# Patient Record
Sex: Female | Born: 1940
Health system: Southern US, Community
[De-identification: ages and names within clinical notes are randomized; demographics above are authoritative.]

## PROBLEM LIST (undated history)

## (undated) DIAGNOSIS — E7800 Pure hypercholesterolemia, unspecified: Secondary | ICD-10-CM

## (undated) HISTORY — PX: CHOLECYSTECTOMY: SHX55

---

## 1999-12-03 ENCOUNTER — Ambulatory Visit (HOSPITAL_BASED_OUTPATIENT_CLINIC_OR_DEPARTMENT_OTHER): Admission: RE | Admit: 1999-12-03 | Discharge: 1999-12-03 | Payer: Self-pay | Admitting: Orthopedic Surgery

## 2001-08-31 ENCOUNTER — Ambulatory Visit (HOSPITAL_COMMUNITY): Admission: RE | Admit: 2001-08-31 | Discharge: 2001-08-31 | Payer: Self-pay | Admitting: Gastroenterology

## 2002-12-24 ENCOUNTER — Encounter: Payer: Self-pay | Admitting: Emergency Medicine

## 2002-12-24 ENCOUNTER — Ambulatory Visit (HOSPITAL_COMMUNITY): Admission: RE | Admit: 2002-12-24 | Discharge: 2002-12-24 | Payer: Self-pay | Admitting: Emergency Medicine

## 2003-04-28 ENCOUNTER — Encounter: Payer: Self-pay | Admitting: Emergency Medicine

## 2003-04-28 ENCOUNTER — Emergency Department (HOSPITAL_COMMUNITY): Admission: EM | Admit: 2003-04-28 | Discharge: 2003-04-29 | Payer: Self-pay | Admitting: Emergency Medicine

## 2003-05-03 ENCOUNTER — Encounter: Payer: Self-pay | Admitting: Cardiology

## 2003-05-03 ENCOUNTER — Ambulatory Visit (HOSPITAL_COMMUNITY): Admission: RE | Admit: 2003-05-03 | Discharge: 2003-05-03 | Payer: Self-pay | Admitting: Cardiology

## 2005-07-23 ENCOUNTER — Encounter: Admission: RE | Admit: 2005-07-23 | Discharge: 2005-07-23 | Payer: Self-pay | Admitting: Emergency Medicine

## 2006-01-26 ENCOUNTER — Encounter: Payer: Self-pay | Admitting: Cardiology

## 2009-06-27 ENCOUNTER — Other Ambulatory Visit: Admission: RE | Admit: 2009-06-27 | Discharge: 2009-06-27 | Payer: Self-pay | Admitting: General Surgery

## 2010-09-18 ENCOUNTER — Ambulatory Visit (HOSPITAL_COMMUNITY)
Admission: RE | Admit: 2010-09-18 | Discharge: 2010-09-18 | Payer: Self-pay | Source: Home / Self Care | Attending: Cardiology | Admitting: Cardiology

## 2011-02-12 NOTE — Procedures (Signed)
[place]. [facility]  Patient:    ANGELES, ZEHNER Visit Number: [id number] MRN: [id number]          Service Type: END Location: ENDO Attending Physician:  Nelda Marseille Dictated by:   Petra Kuba, M.D. Proc. Date: 08/31/01 Admit Date:  08/31/2001   CC:         Oley Balm. Georgina Pillion, M.D.   Procedure Report  PREOPERATIVE DIAGNOSIS:  Colonoscopy.  INDICATION:  Lower abdominal pain, patient due for colonic screening.  Consent was signed after risks, benefits, methods, and options thoroughly discussed in the office.  MEDICATIONS:  Demerol 50 mg, Versed 5 mg.  DESCRIPTION OF PROCEDURE:  Rectal inspection was pertinent for small external hemorrhoids.  Digital exam was negative.  Pediatric video adjustable colonoscope was inserted and with some difficulty due to a tortuous sigmoid with some diverticula, we we were able to advance past that area.  Once past that area, we were easily able to advance to the cecum.  This did not require any position changes but some lower abdominal pressure.  Other than the left-sided tortuous sigmoid filled with some diverticula, no other abnormalities were seen.  The cecum was identified by the appendiceal orifice and the ileocecal valve; in fact, the scope was inserted a short way in the terminal ileum, which was normal.  Photo documentation was obtained.  The scope was slowly withdrawn.  The cecum, ascending, transverse, and majority of the descending looked normal.  The scope was withdrawn around the left side of the colon and again, other than the left-sided diverticula, no other abnormalities were seen.  Once back in the rectum, the scope was then retroflexed, pertinent for some internal hemorrhoids.  The scope was straightened, air was suctioned, scope removed.  The patient tolerated the procedure well.  There was no obvious immediate complication.  ENDOSCOPIC DIAGNOSES: 1. Internal-external hemorrhoids, small. 2.  Sigmoid tortuousity and diverticula. 3. Otherwise within normal limits to the terminal ileum.  PLAN:  GI follow-up p.r.n.  If pain continues, consider lysis of adhesions or CT scan.  Might want to try antispasmodics.  Re-screening in five years, yearly rectals and guaiacs per Dr. Georgina Pillion, and I would be happy to see back if I could be of some assistance. Dictated by:   Petra Kuba, M.D. Attending Physician:  Nelda Marseille DD:  08/31/01 TD:  08/31/01 Job: [phone] [id number]

## 2011-02-12 NOTE — Op Note (Signed)
[place]. [facility]  Patient:    Kara Villa, Kara Villa                         MRN: [id number] Proc. Date: 12/03/99 Adm. Date:  [id number] Disc. Date: [id number] Attending:  Colbert Ewing                           Operative Report  PREOPERATIVE DIAGNOSIS:  Left knee post-traumatic medial plica and synovitis, possible medial meniscus tear.  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  PROCEDURE:  Left knee exam under anesthesia, arthroscopy with chondroplasty of lateral tibial plateau, partial synovectomy, debridement of fraying medial meniscus, and excision of medial plica.  SURGEON:  Loreta Ave, M.D.  ASSISTANT:  Arlys John D. Petrarca, P.A.-C.  ANESTHESIA:  General.  SPECIMENS:  None.  CULTURES:  None.  COMPLICATIONS:  None.  DRESSING:  Self-compressive.  OPERATIVE FINDINGS:  Full motion and stable knee.  At arthroscopy, good patellofemoral tracking and stable ligaments.  Articular cartilage intact with ome just mild grade 2 changes at the patellofemoral joint, chronic with some acute grade 2 and mild grade 3 changes on the lateral tibial plateau.  Debrided to a stable surface and did not account for significant degenerative chondromalacia.  Lateral meniscus intact.  Medial meniscus intact with some fraying at the posterior horn which was easily debrided.  More prominent was hypertrophic synovitis above and below the medial meniscus, all excised.  Also, prominent fibrotic symptomatic medial plica which was excised.  Cruciate ligaments intact.  DESCRIPTION OF PROCEDURE:  The patient was brought to the operating room and placed on the operating table in the supine position.  After adequate anesthesia had been obtained, exam  with the findings as described above.  Tourniquet and leg holder  applied.  Leg prepped and draped in the usual sterile fashion.  Three portals were created, one superolateral, one each medial and lateral parapatellar.  Inflow catheter introduced ________ and the standard arthroscope introduced.  Knee inspected in a serial manner with the findings as described above.  Once the entire knee had been inspected, partial synovectomy, removing hypertrophic synovitis.  Chondroplasty of lateral tibial plateau with a shaver.  Medial meniscus debrided with a shaver, and again, this was just mild fraying, and did not represent a significant tear.  The large fibrotic medial plica was excised and he abrasive changes on the condyle below it were debrided.  Entire knee examined and no other significant findings appreciated.  Instruments and fluid removed. Portals of the knee injected with Marcaine.  Portals closed with 4-0 nylon.  A sterile compressive dressing applied.  Anesthesia reversed and brought to the recovery room.  Tolerated surgery well with no complications. DD:  12/03/99 TD:  12/05/99 Job: [id number]

## 2011-10-15 DIAGNOSIS — N3000 Acute cystitis without hematuria: Secondary | ICD-10-CM | POA: Diagnosis not present

## 2012-04-07 DIAGNOSIS — E785 Hyperlipidemia, unspecified: Secondary | ICD-10-CM | POA: Diagnosis not present

## 2012-04-07 DIAGNOSIS — R5381 Other malaise: Secondary | ICD-10-CM | POA: Diagnosis not present

## 2012-05-17 DIAGNOSIS — S93409A Sprain of unspecified ligament of unspecified ankle, initial encounter: Secondary | ICD-10-CM | POA: Diagnosis not present

## 2012-06-08 DIAGNOSIS — Z79899 Other long term (current) drug therapy: Secondary | ICD-10-CM | POA: Diagnosis not present

## 2012-06-08 DIAGNOSIS — E785 Hyperlipidemia, unspecified: Secondary | ICD-10-CM | POA: Diagnosis not present

## 2012-06-10 DIAGNOSIS — Z23 Encounter for immunization: Secondary | ICD-10-CM | POA: Diagnosis not present

## 2012-07-17 DIAGNOSIS — Z0100 Encounter for examination of eyes and vision without abnormal findings: Secondary | ICD-10-CM | POA: Diagnosis not present

## 2012-07-17 DIAGNOSIS — D485 Neoplasm of uncertain behavior of skin: Secondary | ICD-10-CM | POA: Diagnosis not present

## 2012-07-17 DIAGNOSIS — H2510 Age-related nuclear cataract, unspecified eye: Secondary | ICD-10-CM | POA: Diagnosis not present

## 2012-07-17 DIAGNOSIS — L821 Other seborrheic keratosis: Secondary | ICD-10-CM | POA: Diagnosis not present

## 2012-07-17 DIAGNOSIS — Z85828 Personal history of other malignant neoplasm of skin: Secondary | ICD-10-CM | POA: Diagnosis not present

## 2012-07-26 DIAGNOSIS — C44319 Basal cell carcinoma of skin of other parts of face: Secondary | ICD-10-CM | POA: Diagnosis not present

## 2012-07-26 DIAGNOSIS — C44611 Basal cell carcinoma of skin of unspecified upper limb, including shoulder: Secondary | ICD-10-CM | POA: Diagnosis not present

## 2012-07-26 DIAGNOSIS — D485 Neoplasm of uncertain behavior of skin: Secondary | ICD-10-CM | POA: Diagnosis not present

## 2012-08-11 DIAGNOSIS — C44319 Basal cell carcinoma of skin of other parts of face: Secondary | ICD-10-CM | POA: Diagnosis not present

## 2012-08-11 DIAGNOSIS — C44611 Basal cell carcinoma of skin of unspecified upper limb, including shoulder: Secondary | ICD-10-CM | POA: Diagnosis not present

## 2012-08-29 DIAGNOSIS — Z79899 Other long term (current) drug therapy: Secondary | ICD-10-CM | POA: Diagnosis not present

## 2012-08-29 DIAGNOSIS — Z800 Family history of malignant neoplasm of digestive organs: Secondary | ICD-10-CM | POA: Diagnosis not present

## 2012-08-29 DIAGNOSIS — E785 Hyperlipidemia, unspecified: Secondary | ICD-10-CM | POA: Diagnosis not present

## 2012-08-29 DIAGNOSIS — Z0000 Encounter for general adult medical examination without abnormal findings: Secondary | ICD-10-CM | POA: Diagnosis not present

## 2012-08-29 DIAGNOSIS — E559 Vitamin D deficiency, unspecified: Secondary | ICD-10-CM | POA: Diagnosis not present

## 2012-09-25 DIAGNOSIS — M545 Low back pain, unspecified: Secondary | ICD-10-CM | POA: Diagnosis not present

## 2012-09-29 DIAGNOSIS — L821 Other seborrheic keratosis: Secondary | ICD-10-CM | POA: Diagnosis not present

## 2012-10-26 DIAGNOSIS — Z780 Asymptomatic menopausal state: Secondary | ICD-10-CM | POA: Diagnosis not present

## 2012-10-30 DIAGNOSIS — E559 Vitamin D deficiency, unspecified: Secondary | ICD-10-CM | POA: Diagnosis not present

## 2012-12-09 ENCOUNTER — Encounter (HOSPITAL_COMMUNITY): Payer: Self-pay

## 2012-12-09 ENCOUNTER — Emergency Department (HOSPITAL_COMMUNITY)
Admission: EM | Admit: 2012-12-09 | Discharge: 2012-12-09 | Disposition: A | Discharge status: Home / Self Care | Payer: Medicare Other | Attending: Emergency Medicine | Admitting: Emergency Medicine

## 2012-12-09 ENCOUNTER — Emergency Department (HOSPITAL_COMMUNITY): Payer: Medicare Other

## 2012-12-09 DIAGNOSIS — R197 Diarrhea, unspecified: Secondary | ICD-10-CM | POA: Insufficient documentation

## 2012-12-09 DIAGNOSIS — A088 Other specified intestinal infections: Secondary | ICD-10-CM | POA: Diagnosis not present

## 2012-12-09 DIAGNOSIS — R112 Nausea with vomiting, unspecified: Secondary | ICD-10-CM

## 2012-12-09 DIAGNOSIS — E7800 Pure hypercholesterolemia, unspecified: Secondary | ICD-10-CM | POA: Diagnosis not present

## 2012-12-09 DIAGNOSIS — R109 Unspecified abdominal pain: Secondary | ICD-10-CM | POA: Diagnosis not present

## 2012-12-09 DIAGNOSIS — A084 Viral intestinal infection, unspecified: Secondary | ICD-10-CM

## 2012-12-09 HISTORY — DX: Pure hypercholesterolemia, unspecified: E78.00

## 2012-12-09 LAB — POCT I-STAT, CHEM 8
BUN: 12 mg/dL (ref 6–23)
Calcium, Ion: 1.04 mmol/L — ABNORMAL LOW (ref 1.13–1.30)
Chloride: 109 mEq/L (ref 96–112)
Creatinine, Ser: 0.6 mg/dL (ref 0.50–1.10)
HCT: 37 % (ref 36.0–46.0)
Hemoglobin: 12.6 g/dL (ref 12.0–15.0)
Sodium: 142 mEq/L (ref 135–145)
TCO2: 27 mmol/L (ref 0–100)

## 2012-12-09 LAB — CBC
HCT: 37.4 % (ref 36.0–46.0)
Hemoglobin: 12.9 g/dL (ref 12.0–15.0)
MCH: 30.7 pg (ref 26.0–34.0)
MCV: 89 fL (ref 78.0–100.0)
Platelets: 230 10*3/uL (ref 150–400)
RBC: 4.2 MIL/uL (ref 3.87–5.11)

## 2012-12-09 MED ORDER — DIPHENOXYLATE-ATROPINE 2.5-0.025 MG PO TABS
1.0000 | ORAL_TABLET | Freq: Once | ORAL | Status: AC
  Administered 2012-12-09: 1 via ORAL
  Filled 2012-12-09: qty 1

## 2012-12-09 MED ORDER — SODIUM CHLORIDE 0.9 % IV BOLUS (SEPSIS)
1000.0000 mL | Freq: Once | INTRAVENOUS | Status: AC
  Administered 2012-12-09: 1000 mL via INTRAVENOUS

## 2012-12-09 MED ORDER — ONDANSETRON HCL 4 MG/2ML IJ SOLN
4.0000 mg | Freq: Once | INTRAMUSCULAR | Status: AC
  Administered 2012-12-09: 4 mg via INTRAVENOUS
  Filled 2012-12-09: qty 2

## 2012-12-09 MED ORDER — ONDANSETRON HCL 4 MG PO TABS: 4.0000 mg | ORAL_TABLET | Freq: Four times a day (QID) | ORAL | Status: AC

## 2012-12-09 NOTE — ED Notes (Signed)
Patient transported to X-ray 

## 2012-12-09 NOTE — Discharge Instructions (Signed)
Stay well hydrated. Use zofran for nausea. Eat a bland diet for the next few days. Your labs and xray were normal today.  B.R.A.T. Diet Your doctor has recommended the B.R.A.T. diet for you or your child until the condition improves. This is often used to help control diarrhea and vomiting symptoms. If you or your child can tolerate clear liquids, you may have:  Bananas.   Rice.   Applesauce.   Toast (and other simple starches such as crackers, potatoes, noodles).  Be sure to avoid dairy products, meats, and fatty foods until symptoms are better. Fruit juices such as apple, grape, and prune juice can make diarrhea worse. Avoid these. Continue this diet for 2 days or as instructed by your caregiver. Document Released: 09/13/2005 Document Revised: 09/02/2011 Document Reviewed: 03/02/2007 [facility] Patient Information 2012 [place], [place]. Diet for Diarrhea, Adult Having frequent, runny stools (diarrhea) has many causes. Diarrhea may be caused or worsened by food or drink. Diarrhea may be relieved by changing your diet. IF YOU ARE NOT TOLERATING SOLID FOODS:  Drink enough water and fluids to keep your urine clear or pale yellow.  Avoid sugary drinks and sodas as well as milk-based beverages.  Avoid beverages containing caffeine and alcohol.  You may try rehydrating beverages. You can make your own by following this recipe:   tsp table salt.   tsp baking soda.   tsp salt substitute (potassium chloride).  1 tbs + 1 tsp sugar.  1 qt water. As your stools become more solid, you can start eating solid foods. Add foods one at a time. If a certain food causes your diarrhea to get worse, avoid that food and try other foods. A low fiber, low-fat, and lactose-free diet is recommended. Small, frequent meals may be better tolerated.  Starches  Allowed:  White, [place], and pita breads, plain rolls, buns, bagels. Plain muffins, matzo. Soda, saltine, or [place] crackers. Pretzels, melba toast,  zwieback. Cooked cereals made with water: cornmeal, farina, cream cereals. Dry cereals: refined corn, wheat, rice. Potatoes prepared any way without skins, refined macaroni, spaghetti, noodles, refined rice.  Avoid:  Bread, rolls, or crackers made with whole wheat, multi-grains, rye, bran seeds, nuts, or coconut. Corn tortillas or taco shells. Cereals containing whole grains, multi-grains, bran, coconut, nuts, or raisins. Cooked or dry oatmeal. Coarse wheat cereals, granola. Cereals advertised as "high-fiber." Potato skins. Whole grain pasta, wild or brown rice. Popcorn. Sweet potatoes/yams. Sweet rolls, doughnuts, waffles, pancakes, sweet breads. Vegetables  Allowed: Strained tomato and vegetable juices. Most well-cooked and canned vegetables without seeds. Fresh: Tender lettuce, cucumber without the skin, cabbage, spinach, bean sprouts.  Avoid: Fresh, cooked, or canned: Artichokes, baked beans, beet greens, broccoli, Brussels sprouts, corn, kale, legumes, peas, sweet potatoes. Cooked: Green or red cabbage, spinach. Avoid large servings of any vegetables, because vegetables shrink when cooked, and they contain more fiber per serving than fresh vegetables. Fruit  Allowed: All fruit juices except prune juice. Cooked or canned: Apricots, applesauce, cantaloupe, cherries, fruit cocktail, grapefruit, grapes, kiwi, mandarin oranges, peaches, pears, plums, watermelon. Fresh: Apples without skin, ripe banana, grapes, cantaloupe, cherries, grapefruit, peaches, oranges, plums. Keep servings limited to  cup or 1 piece.  Avoid: Fresh: Apple with skin, apricots, mango, pears, raspberries, strawberries. Prune juice, stewed or dried prunes. Dried fruits, raisins, dates. Large servings of all fresh fruits. Meat and Meat Substitutes  Allowed: Ground or well-cooked tender beef, ham, veal, lamb, pork, or poultry. Eggs, plain cheese. Fish, oysters, shrimp, lobster, other seafoods. Liver, organ  meats.  Avoid: Tough,  fibrous meats with gristle. Peanut butter, smooth or chunky. Cheese, nuts, seeds, legumes, dried peas, beans, lentils. Milk  Allowed: Yogurt, lactose-free milk, kefir, drinkable yogurt, buttermilk, soy milk.  Avoid: Milk, chocolate milk, beverages made with milk, such as milk shakes. Soups  Allowed: Bouillon, broth, or soups made from allowed foods. Any strained soup.  Avoid: Soups made from vegetables that are not allowed, cream or milk-based soups. Desserts and Sweets  Allowed: Sugar-free gelatin, sugar-free frozen ice pops made without sugar alcohol.  Avoid: Plain cakes and cookies, pie made with allowed fruit, pudding, custard, cream pie. Gelatin, fruit, ice, sherbet, frozen ice pops. Ice cream, ice milk without nuts. Plain hard candy, honey, jelly, molasses, syrup, sugar, chocolate syrup, gumdrops, marshmallows. Fats and Oils  Allowed: Avoid any fats and oils.  Avoid: Seeds, nuts, olives, avocados. Margarine, butter, cream, mayonnaise, salad oils, plain salad dressings made from allowed foods. Plain gravy, crisp bacon without rind. Beverages  Allowed: Water, decaffeinated teas, oral rehydration solutions, sugar-free beverages.  Avoid: Fruit juices, caffeinated beverages (coffee, tea, soda or pop), alcohol, sports drinks, or lemon-lime soda or pop. Condiments  Allowed: Ketchup, mustard, horseradish, vinegar, cream sauce, cheese sauce, cocoa powder. Spices in moderation: allspice, basil, bay leaves, celery powder or leaves, cinnamon, cumin powder, curry powder, ginger, mace, marjoram, onion or garlic powder, oregano, paprika, parsley flakes, ground pepper, rosemary, sage, savory, tarragon, thyme, turmeric.  Avoid: Coconut, honey. Weight Monitoring: Weigh yourself every day. You should weigh yourself in the morning after you urinate and before you eat breakfast. Wear the same amount of clothing when you weigh yourself. Record your weight daily. Bring your recorded weights to your  clinic visits. Tell your caregiver right away if you have gained 3 lb/1.4 kg or more in 1 day, 5 lb/2.3 kg in a week, or whatever amount you were told to report. SEEK IMMEDIATE MEDICAL CARE IF:   You are unable to keep fluids down.  You start to throw up (vomit) or diarrhea keeps coming back (persistent).  Abdominal pain develops, increases, or can be felt in one place (localizes).  You have an oral temperature above 102 F (38.9 C), not controlled by medicine.  Diarrhea contains blood or mucus.  You develop excessive weakness, dizziness, fainting, or extreme thirst. MAKE SURE YOU:   Understand these instructions.  Will watch your condition.  Will get help right away if you are not doing well or get worse. Document Released: 12/04/2003 Document Revised: 12/06/2011 Document Reviewed: 01/28/2012 [facility] Patient Information 2013 [place], [place].  Nausea and Vomiting Nausea is a sick feeling that often comes before throwing up (vomiting). Vomiting is a reflex where stomach contents come out of your mouth. Vomiting can cause severe loss of body fluids (dehydration). Children and elderly adults can become dehydrated quickly, especially if they also have diarrhea. Nausea and vomiting are symptoms of a condition or disease. It is important to find the cause of your symptoms. CAUSES   Direct irritation of the stomach lining. This irritation can result from increased acid production (gastroesophageal reflux disease), infection, food poisoning, taking certain medicines (such as nonsteroidal anti-inflammatory drugs), alcohol use, or tobacco use.  Signals from the brain.These signals could be caused by a headache, heat exposure, an inner ear disturbance, increased pressure in the brain from injury, infection, a tumor, or a concussion, pain, emotional stimulus, or metabolic problems.  An obstruction in the gastrointestinal tract (bowel obstruction).  Illnesses such as diabetes, hepatitis,  gallbladder problems, appendicitis, kidney  problems, cancer, sepsis, atypical symptoms of a heart attack, or eating disorders.  Medical treatments such as chemotherapy and radiation.  Receiving medicine that makes you sleep (general anesthetic) during surgery. DIAGNOSIS Your caregiver may ask for tests to be done if the problems do not improve after a few days. Tests may also be done if symptoms are severe or if the reason for the nausea and vomiting is not clear. Tests may include:  Urine tests.  Blood tests.  Stool tests.  Cultures (to look for evidence of infection).  X-rays or other imaging studies. Test results can help your caregiver make decisions about treatment or the need for additional tests. TREATMENT You need to stay well hydrated. Drink frequently but in small amounts.You may wish to drink water, sports drinks, clear broth, or eat frozen ice pops or gelatin dessert to help stay hydrated.When you eat, eating slowly may help prevent nausea.There are also some antinausea medicines that may help prevent nausea. HOME CARE INSTRUCTIONS   Take all medicine as directed by your caregiver.  If you do not have an appetite, do not force yourself to eat. However, you must continue to drink fluids.  If you have an appetite, eat a normal diet unless your caregiver tells you differently.  Eat a variety of complex carbohydrates (rice, wheat, potatoes, bread), lean meats, yogurt, fruits, and vegetables.  Avoid high-fat foods because they are more difficult to digest.  Drink enough water and fluids to keep your urine clear or pale yellow.  If you are dehydrated, ask your caregiver for specific rehydration instructions. Signs of dehydration may include:  Severe thirst.  Dry lips and mouth.  Dizziness.  Dark urine.  Decreasing urine frequency and amount.  Confusion.  Rapid breathing or pulse. SEEK IMMEDIATE MEDICAL CARE IF:   You have blood or brown flecks (like coffee  grounds) in your vomit.  You have black or bloody stools.  You have a severe headache or stiff neck.  You are confused.  You have severe abdominal pain.  You have chest pain or trouble breathing.  You do not urinate at least once every 8 hours.  You develop cold or clammy skin.  You continue to vomit for longer than 24 to 48 hours.  You have a fever. MAKE SURE YOU:   Understand these instructions.  Will watch your condition.  Will get help right away if you are not doing well or get worse. Document Released: 09/13/2005 Document Revised: 12/06/2011 Document Reviewed: 02/10/2011 [facility] Patient Information 2013 [place], [place].  Viral Gastroenteritis Viral gastroenteritis is also known as stomach flu. This condition affects the stomach and intestinal tract. It can cause sudden diarrhea and vomiting. The illness typically lasts 3 to 8 days. Most people develop an immune response that eventually gets rid of the virus. While this natural response develops, the virus can make you quite ill. CAUSES  Many different viruses can cause gastroenteritis, such as rotavirus or noroviruses. You can catch one of these viruses by consuming contaminated food or water. You may also catch a virus by sharing utensils or other personal items with an infected person or by touching a contaminated surface. SYMPTOMS  The most common symptoms are diarrhea and vomiting. These problems can cause a severe loss of body fluids (dehydration) and a body salt (electrolyte) imbalance. Other symptoms may include:  Fever.  Headache.  Fatigue.  Abdominal pain. DIAGNOSIS  Your caregiver can usually diagnose viral gastroenteritis based on your symptoms and a physical exam. A stool  sample may also be taken to test for the presence of viruses or other infections. TREATMENT  This illness typically goes away on its own. Treatments are aimed at rehydration. The most serious cases of viral gastroenteritis involve  vomiting so severely that you are not able to keep fluids down. In these cases, fluids must be given through an intravenous line (IV). HOME CARE INSTRUCTIONS   Drink enough fluids to keep your urine clear or pale yellow. Drink small amounts of fluids frequently and increase the amounts as tolerated.  Ask your caregiver for specific rehydration instructions.  Avoid:  Foods high in sugar.  Alcohol.  Carbonated drinks.  Tobacco.  Juice.  Caffeine drinks.  Extremely hot or cold fluids.  Fatty, greasy foods.  Too much intake of anything at one time.  Dairy products until 24 to 48 hours after diarrhea stops.  You may consume probiotics. Probiotics are active cultures of beneficial bacteria. They may lessen the amount and number of diarrheal stools in adults. Probiotics can be found in yogurt with active cultures and in supplements.  Wash your hands well to avoid spreading the virus.  Only take over-the-counter or prescription medicines for pain, discomfort, or fever as directed by your caregiver. Do not give aspirin to children. Antidiarrheal medicines are not recommended.  Ask your caregiver if you should continue to take your regular prescribed and over-the-counter medicines.  Keep all follow-up appointments as directed by your caregiver. SEEK IMMEDIATE MEDICAL CARE IF:   You are unable to keep fluids down.  You do not urinate at least once every 6 to 8 hours.  You develop shortness of breath.  You notice blood in your stool or vomit. This may look like coffee grounds.  You have abdominal pain that increases or is concentrated in one small area (localized).  You have persistent vomiting or diarrhea.  You have a fever.  The patient is a child younger than 3 months, and he or she has a fever.  The patient is a child older than 3 months, and he or she has a fever and persistent symptoms.  The patient is a child older than 3 months, and he or she has a fever and  symptoms suddenly get worse.  The patient is a baby, and he or she has no tears when crying. MAKE SURE YOU:   Understand these instructions.  Will watch your condition.  Will get help right away if you are not doing well or get worse. Document Released: 09/13/2005 Document Revised: 12/06/2011 Document Reviewed: 06/30/2011 [facility] Patient Information 2013 [place], [place].

## 2012-12-09 NOTE — ED Notes (Signed)
Pt here by ems for n/v/d after eating at [facility]. Denies any other symptoms. Denies pain.

## 2012-12-09 NOTE — ED Provider Notes (Signed)
History     CSN: [id number]  Arrival date & time 12/09/12  0847   First MD Initiated Contact with Patient 12/09/12 [phone]      No chief complaint on file.   (Consider location/radiation/quality/duration/timing/severity/associated sxs/prior treatment) HPI Comments: 72 year old female presents to the emergency department complaining of sudden onset nausea, vomiting and diarrhea x 1 day beginning around 1:00 this morning. States she was taking her dog out in the middle of the night when symptoms began. Prior to going to bed last night patient states she is feeling fine. Admits to eating tackled fell around 3 clock in the afternoon yesterday followed by some "funny looking" orange Jell-O and ice cream later at night. She has had about 4 episodes of vomiting and multiple episodes of diarrhea. Denies hematemesis or hematochezia. Denies abdominal pain, fever, chills or diaphoresis. No sick contacts.  The history is provided by the patient.    Past Medical History  Diagnosis Date  . High cholesterol     No past surgical history on file.  No family history on file.  History  Substance Use Topics  . Smoking status: Not on file  . Smokeless tobacco: Not on file  . Alcohol Use: Not on file    OB History   Grav Para Term Preterm Abortions TAB SAB Ect Mult Living                  Review of Systems  Constitutional: Negative for fever, chills and activity change.  Gastrointestinal: Positive for nausea, vomiting and diarrhea. Negative for abdominal pain and blood in stool.  Musculoskeletal: Negative for back pain.  Neurological: Positive for weakness. Negative for dizziness, light-headedness and headaches.  All other systems reviewed and are negative.    Allergies  Azithromycin  Home Medications   Current Outpatient Rx  Name  Route  Sig  Dispense  Refill  . calcium citrate-vitamin D (CITRACAL+D) 315-200 MG-UNIT per tablet   Oral   Take 1 tablet by mouth 2 (two) times daily.          . Cholecalciferol (VITAMIN D3) 2000 UNITS capsule   Oral   Take 2,000 Units by mouth daily.         . Red Yeast Rice 600 MG CAPS   Oral   Take 1 capsule by mouth 2 (two) times daily.           BP 135/58  Pulse 84  Temp(Src) 98.7 F (37.1 C) (Oral)  Resp 14  SpO2 100%  Physical Exam  Nursing note and vitals reviewed. Constitutional: She is oriented to person, place, and time. She appears well-developed and well-nourished. No distress.  HENT:  Head: Normocephalic and atraumatic.  Mouth/Throat: Oropharynx is clear and moist and mucous membranes are normal. Mucous membranes are not pale and not dry.  Eyes: Conjunctivae and EOM are normal. Pupils are equal, round, and reactive to light.  Neck: Normal range of motion. Neck supple.  Cardiovascular: Normal rate, regular rhythm, normal heart sounds and intact distal pulses.   Pulmonary/Chest: Effort normal and breath sounds normal. No respiratory distress.  Abdominal: Soft. Normal appearance. She exhibits no distension and no mass. Bowel sounds are increased. There is generalized tenderness (to deep palpation only described as "discomfort" rather than pain). There is no rigidity, no rebound and no guarding.  Musculoskeletal: Normal range of motion. She exhibits no edema.  Neurological: She is alert and oriented to person, place, and time.  Skin: Skin is warm and dry. She is  not diaphoretic. No pallor.  Psychiatric: She has a normal mood and affect. Her behavior is normal.    ED Course  Procedures (including critical care time)  Labs Reviewed  POCT I-STAT, CHEM 8 - Abnormal; Notable for the following:    Glucose, Bld 105 (*)    Calcium, Ion 1.04 (*)    All other components within normal limits  CBC   Dg Abd Acute W/chest  12/09/2012  *RADIOLOGY REPORT*  Clinical Data: 72 year old female with abdominal pain, nausea, vomiting and diarrhea.  ACUTE ABDOMEN SERIES (ABDOMEN 2 VIEW & CHEST 1 VIEW)  Comparison: None  Findings:  The cardiomediastinal silhouette is unremarkable. The lungs are clear. There is no evidence of airspace disease, consolidation, pleural effusion or pneumothorax.  A few nondistended gas and fluid filled loops of small bowel and colon noted. There is no evidence of dilated bowel loops or pneumoperitoneum. No suspicious calcifications are present. Cholecystectomy clips are identified.  IMPRESSION: Nonspecific nonobstructive bowel gas pattern.  This appearance can be seen with gastroenteritis.  No evidence of acute cardiopulmonary disease.   Original Report Authenticated By: Harmon Pier, M.D.      1. Viral gastroenteritis   2. Nausea and vomiting   3. Diarrhea       MDM  Viral gastroenteritis- obtained basic labs and AAS due to hx of multiple abdominal surgeries. AAS with evidence of viral etiology, no other acute finding. Labs unremarkable. She feels much better with fluids, zofran and lomotil. She does not want any medications to go home with, however I did get her to agree to zofran in case she becomes nauseated. Return precautions discussed. Case discussed with Dr. Lynelle Doctor who also evaluated patient and agrees with plan of care.        Trevor Mace, PA-C 12/09/12 1122

## 2012-12-09 NOTE — ED Provider Notes (Signed)
Medical screening examination/treatment/procedure(s) were conducted as a shared visit with non-physician practitioner(s) and myself.  I personally evaluated the patient during the encounter   Celene Kras, MD 12/09/12 1126

## 2012-12-09 NOTE — ED Provider Notes (Signed)
Pt presents with n/v/d after [employer] last night.  She has some abdominal discomfort although mild.  Has history of prior surgery.  GB, appendectomy, hysterctomy.   Physical Exam  BP 135/58  Pulse 84  Temp(Src) 98.7 F (37.1 C) (Oral)  Resp 14  SpO2 100%  Physical Exam  Nursing note and vitals reviewed. Constitutional: She appears well-developed and well-nourished. No distress.  HENT:  Head: Normocephalic and atraumatic.  Right Ear: External ear normal.  Left Ear: External ear normal.  Eyes: Conjunctivae are normal. Right eye exhibits no discharge. Left eye exhibits no discharge. No scleral icterus.  Neck: Neck supple. No tracheal deviation present.  Cardiovascular: Normal rate.   Pulmonary/Chest: Effort normal. No stridor. No respiratory distress.  Abdominal: Soft. She exhibits no distension. There is no tenderness. There is no rebound and no guarding.  Musculoskeletal: She exhibits no edema.  Neurological: She is alert. Cranial nerve deficit: no gross deficits.  Skin: Skin is warm and dry. No rash noted.  Psychiatric: She has a normal mood and affect.    ED Course  Procedures  MDM Fluids, antiemetics.  Check labs and aas.      Celene Kras, MD 12/09/12 [phone]

## 2013-01-01 DIAGNOSIS — W57XXXA Bitten or stung by nonvenomous insect and other nonvenomous arthropods, initial encounter: Secondary | ICD-10-CM | POA: Diagnosis not present

## 2013-05-01 DIAGNOSIS — E559 Vitamin D deficiency, unspecified: Secondary | ICD-10-CM | POA: Diagnosis not present

## 2013-06-18 DIAGNOSIS — Z23 Encounter for immunization: Secondary | ICD-10-CM | POA: Diagnosis not present

## 2013-06-19 DIAGNOSIS — L820 Inflamed seborrheic keratosis: Secondary | ICD-10-CM | POA: Diagnosis not present

## 2013-07-23 DIAGNOSIS — Z85828 Personal history of other malignant neoplasm of skin: Secondary | ICD-10-CM | POA: Diagnosis not present

## 2013-07-23 DIAGNOSIS — L821 Other seborrheic keratosis: Secondary | ICD-10-CM | POA: Diagnosis not present

## 2013-07-23 DIAGNOSIS — D1801 Hemangioma of skin and subcutaneous tissue: Secondary | ICD-10-CM | POA: Diagnosis not present

## 2013-08-30 DIAGNOSIS — Z0000 Encounter for general adult medical examination without abnormal findings: Secondary | ICD-10-CM | POA: Diagnosis not present

## 2013-08-30 DIAGNOSIS — Z800 Family history of malignant neoplasm of digestive organs: Secondary | ICD-10-CM | POA: Diagnosis not present

## 2013-08-30 DIAGNOSIS — K219 Gastro-esophageal reflux disease without esophagitis: Secondary | ICD-10-CM | POA: Diagnosis not present

## 2013-08-30 DIAGNOSIS — M899 Disorder of bone, unspecified: Secondary | ICD-10-CM | POA: Diagnosis not present

## 2013-08-30 DIAGNOSIS — E559 Vitamin D deficiency, unspecified: Secondary | ICD-10-CM | POA: Diagnosis not present

## 2013-08-30 DIAGNOSIS — E785 Hyperlipidemia, unspecified: Secondary | ICD-10-CM | POA: Diagnosis not present

## 2013-08-30 DIAGNOSIS — N6019 Diffuse cystic mastopathy of unspecified breast: Secondary | ICD-10-CM | POA: Diagnosis not present

## 2013-08-30 DIAGNOSIS — C4491 Basal cell carcinoma of skin, unspecified: Secondary | ICD-10-CM | POA: Diagnosis not present

## 2013-08-30 DIAGNOSIS — H409 Unspecified glaucoma: Secondary | ICD-10-CM | POA: Diagnosis not present

## 2013-09-03 DIAGNOSIS — H2510 Age-related nuclear cataract, unspecified eye: Secondary | ICD-10-CM | POA: Diagnosis not present

## 2013-09-03 DIAGNOSIS — H5210 Myopia, unspecified eye: Secondary | ICD-10-CM | POA: Diagnosis not present

## 2013-11-16 DIAGNOSIS — E559 Vitamin D deficiency, unspecified: Secondary | ICD-10-CM | POA: Diagnosis not present

## 2014-02-22 DIAGNOSIS — Z1231 Encounter for screening mammogram for malignant neoplasm of breast: Secondary | ICD-10-CM | POA: Diagnosis not present

## 2014-03-12 DIAGNOSIS — L820 Inflamed seborrheic keratosis: Secondary | ICD-10-CM | POA: Diagnosis not present

## 2014-03-12 DIAGNOSIS — D235 Other benign neoplasm of skin of trunk: Secondary | ICD-10-CM | POA: Diagnosis not present

## 2014-06-25 DIAGNOSIS — Z23 Encounter for immunization: Secondary | ICD-10-CM | POA: Diagnosis not present

## 2014-07-24 DIAGNOSIS — D2271 Melanocytic nevi of right lower limb, including hip: Secondary | ICD-10-CM | POA: Diagnosis not present

## 2014-07-24 DIAGNOSIS — C44619 Basal cell carcinoma of skin of left upper limb, including shoulder: Secondary | ICD-10-CM | POA: Diagnosis not present

## 2014-07-24 DIAGNOSIS — Z85828 Personal history of other malignant neoplasm of skin: Secondary | ICD-10-CM | POA: Diagnosis not present

## 2014-07-24 DIAGNOSIS — L820 Inflamed seborrheic keratosis: Secondary | ICD-10-CM | POA: Diagnosis not present

## 2014-07-24 DIAGNOSIS — L821 Other seborrheic keratosis: Secondary | ICD-10-CM | POA: Diagnosis not present

## 2014-07-24 DIAGNOSIS — D492 Neoplasm of unspecified behavior of bone, soft tissue, and skin: Secondary | ICD-10-CM | POA: Diagnosis not present

## 2014-07-26 DIAGNOSIS — N952 Postmenopausal atrophic vaginitis: Secondary | ICD-10-CM | POA: Diagnosis not present

## 2014-07-26 DIAGNOSIS — N898 Other specified noninflammatory disorders of vagina: Secondary | ICD-10-CM | POA: Diagnosis not present

## 2014-09-05 DIAGNOSIS — M899 Disorder of bone, unspecified: Secondary | ICD-10-CM | POA: Diagnosis not present

## 2014-09-05 DIAGNOSIS — E559 Vitamin D deficiency, unspecified: Secondary | ICD-10-CM | POA: Diagnosis not present

## 2014-09-05 DIAGNOSIS — Z79899 Other long term (current) drug therapy: Secondary | ICD-10-CM | POA: Diagnosis not present

## 2014-09-05 DIAGNOSIS — N760 Acute vaginitis: Secondary | ICD-10-CM | POA: Diagnosis not present

## 2014-09-05 DIAGNOSIS — Z800 Family history of malignant neoplasm of digestive organs: Secondary | ICD-10-CM | POA: Diagnosis not present

## 2014-09-05 DIAGNOSIS — K219 Gastro-esophageal reflux disease without esophagitis: Secondary | ICD-10-CM | POA: Diagnosis not present

## 2014-09-05 DIAGNOSIS — Z0000 Encounter for general adult medical examination without abnormal findings: Secondary | ICD-10-CM | POA: Diagnosis not present

## 2014-09-05 DIAGNOSIS — E785 Hyperlipidemia, unspecified: Secondary | ICD-10-CM | POA: Diagnosis not present

## 2014-09-06 DIAGNOSIS — H2513 Age-related nuclear cataract, bilateral: Secondary | ICD-10-CM | POA: Diagnosis not present

## 2014-09-06 DIAGNOSIS — H5203 Hypermetropia, bilateral: Secondary | ICD-10-CM | POA: Diagnosis not present

## 2014-09-11 DIAGNOSIS — D485 Neoplasm of uncertain behavior of skin: Secondary | ICD-10-CM | POA: Diagnosis not present

## 2014-09-11 DIAGNOSIS — D0472 Carcinoma in situ of skin of left lower limb, including hip: Secondary | ICD-10-CM | POA: Diagnosis not present

## 2014-09-11 DIAGNOSIS — C44619 Basal cell carcinoma of skin of left upper limb, including shoulder: Secondary | ICD-10-CM | POA: Diagnosis not present

## 2014-10-23 DIAGNOSIS — M899 Disorder of bone, unspecified: Secondary | ICD-10-CM | POA: Diagnosis not present

## 2014-10-23 DIAGNOSIS — M8580 Other specified disorders of bone density and structure, unspecified site: Secondary | ICD-10-CM | POA: Diagnosis not present

## 2014-11-06 DIAGNOSIS — D0472 Carcinoma in situ of skin of left lower limb, including hip: Secondary | ICD-10-CM | POA: Diagnosis not present

## 2014-12-13 DIAGNOSIS — D485 Neoplasm of uncertain behavior of skin: Secondary | ICD-10-CM | POA: Diagnosis not present

## 2014-12-13 DIAGNOSIS — L821 Other seborrheic keratosis: Secondary | ICD-10-CM | POA: Diagnosis not present

## 2014-12-13 DIAGNOSIS — L820 Inflamed seborrheic keratosis: Secondary | ICD-10-CM | POA: Diagnosis not present

## 2014-12-13 DIAGNOSIS — Z85828 Personal history of other malignant neoplasm of skin: Secondary | ICD-10-CM | POA: Diagnosis not present

## 2015-02-25 DIAGNOSIS — Z1231 Encounter for screening mammogram for malignant neoplasm of breast: Secondary | ICD-10-CM | POA: Diagnosis not present

## 2015-03-10 DIAGNOSIS — H2513 Age-related nuclear cataract, bilateral: Secondary | ICD-10-CM | POA: Diagnosis not present

## 2015-03-10 DIAGNOSIS — H5203 Hypermetropia, bilateral: Secondary | ICD-10-CM | POA: Diagnosis not present

## 2015-07-18 DIAGNOSIS — D225 Melanocytic nevi of trunk: Secondary | ICD-10-CM | POA: Diagnosis not present

## 2015-07-18 DIAGNOSIS — D485 Neoplasm of uncertain behavior of skin: Secondary | ICD-10-CM | POA: Diagnosis not present

## 2015-07-18 DIAGNOSIS — L821 Other seborrheic keratosis: Secondary | ICD-10-CM | POA: Diagnosis not present

## 2015-07-18 DIAGNOSIS — D1801 Hemangioma of skin and subcutaneous tissue: Secondary | ICD-10-CM | POA: Diagnosis not present

## 2015-07-18 DIAGNOSIS — Z23 Encounter for immunization: Secondary | ICD-10-CM | POA: Diagnosis not present

## 2015-07-18 DIAGNOSIS — L739 Follicular disorder, unspecified: Secondary | ICD-10-CM | POA: Diagnosis not present

## 2015-07-18 DIAGNOSIS — Z85828 Personal history of other malignant neoplasm of skin: Secondary | ICD-10-CM | POA: Diagnosis not present

## 2015-07-18 DIAGNOSIS — L820 Inflamed seborrheic keratosis: Secondary | ICD-10-CM | POA: Diagnosis not present

## 2015-07-18 DIAGNOSIS — L812 Freckles: Secondary | ICD-10-CM | POA: Diagnosis not present

## 2015-07-18 DIAGNOSIS — D2239 Melanocytic nevi of other parts of face: Secondary | ICD-10-CM | POA: Diagnosis not present

## 2015-09-09 DIAGNOSIS — H40033 Anatomical narrow angle, bilateral: Secondary | ICD-10-CM | POA: Diagnosis not present

## 2015-09-09 DIAGNOSIS — H2513 Age-related nuclear cataract, bilateral: Secondary | ICD-10-CM | POA: Diagnosis not present

## 2015-09-17 DIAGNOSIS — E785 Hyperlipidemia, unspecified: Secondary | ICD-10-CM | POA: Diagnosis not present

## 2015-09-17 DIAGNOSIS — C4491 Basal cell carcinoma of skin, unspecified: Secondary | ICD-10-CM | POA: Diagnosis not present

## 2015-09-17 DIAGNOSIS — M899 Disorder of bone, unspecified: Secondary | ICD-10-CM | POA: Diagnosis not present

## 2015-09-17 DIAGNOSIS — E559 Vitamin D deficiency, unspecified: Secondary | ICD-10-CM | POA: Diagnosis not present

## 2015-09-17 DIAGNOSIS — Z0000 Encounter for general adult medical examination without abnormal findings: Secondary | ICD-10-CM | POA: Diagnosis not present

## 2015-09-17 DIAGNOSIS — K219 Gastro-esophageal reflux disease without esophagitis: Secondary | ICD-10-CM | POA: Diagnosis not present

## 2015-09-17 DIAGNOSIS — Z23 Encounter for immunization: Secondary | ICD-10-CM | POA: Diagnosis not present

## 2015-09-17 DIAGNOSIS — H409 Unspecified glaucoma: Secondary | ICD-10-CM | POA: Diagnosis not present

## 2015-10-09 DIAGNOSIS — H21561 Pupillary abnormality, right eye: Secondary | ICD-10-CM | POA: Diagnosis not present

## 2015-10-09 DIAGNOSIS — H25811 Combined forms of age-related cataract, right eye: Secondary | ICD-10-CM | POA: Diagnosis not present

## 2015-10-09 DIAGNOSIS — H2511 Age-related nuclear cataract, right eye: Secondary | ICD-10-CM | POA: Diagnosis not present

## 2015-12-02 DIAGNOSIS — D124 Benign neoplasm of descending colon: Secondary | ICD-10-CM | POA: Diagnosis not present

## 2015-12-02 DIAGNOSIS — D126 Benign neoplasm of colon, unspecified: Secondary | ICD-10-CM | POA: Diagnosis not present

## 2015-12-02 DIAGNOSIS — Z86010 Personal history of colonic polyps: Secondary | ICD-10-CM | POA: Diagnosis not present

## 2015-12-02 DIAGNOSIS — K5730 Diverticulosis of large intestine without perforation or abscess without bleeding: Secondary | ICD-10-CM | POA: Diagnosis not present

## 2016-01-21 DIAGNOSIS — L812 Freckles: Secondary | ICD-10-CM | POA: Diagnosis not present

## 2016-01-21 DIAGNOSIS — L821 Other seborrheic keratosis: Secondary | ICD-10-CM | POA: Diagnosis not present

## 2016-01-21 DIAGNOSIS — L820 Inflamed seborrheic keratosis: Secondary | ICD-10-CM | POA: Diagnosis not present

## 2016-01-21 DIAGNOSIS — D225 Melanocytic nevi of trunk: Secondary | ICD-10-CM | POA: Diagnosis not present

## 2016-01-21 DIAGNOSIS — D1801 Hemangioma of skin and subcutaneous tissue: Secondary | ICD-10-CM | POA: Diagnosis not present

## 2016-01-21 DIAGNOSIS — Z85828 Personal history of other malignant neoplasm of skin: Secondary | ICD-10-CM | POA: Diagnosis not present

## 2016-01-30 DIAGNOSIS — H0015 Chalazion left lower eyelid: Secondary | ICD-10-CM | POA: Diagnosis not present

## 2016-02-26 DIAGNOSIS — H2512 Age-related nuclear cataract, left eye: Secondary | ICD-10-CM | POA: Diagnosis not present

## 2016-02-26 DIAGNOSIS — H21562 Pupillary abnormality, left eye: Secondary | ICD-10-CM | POA: Diagnosis not present

## 2016-02-26 DIAGNOSIS — H25812 Combined forms of age-related cataract, left eye: Secondary | ICD-10-CM | POA: Diagnosis not present

## 2016-03-03 DIAGNOSIS — Z1231 Encounter for screening mammogram for malignant neoplasm of breast: Secondary | ICD-10-CM | POA: Diagnosis not present

## 2016-04-14 DIAGNOSIS — N3000 Acute cystitis without hematuria: Secondary | ICD-10-CM | POA: Diagnosis not present

## 2016-04-14 DIAGNOSIS — R300 Dysuria: Secondary | ICD-10-CM | POA: Diagnosis not present

## 2016-06-14 DIAGNOSIS — Z23 Encounter for immunization: Secondary | ICD-10-CM | POA: Diagnosis not present

## 2016-10-01 DIAGNOSIS — I10 Essential (primary) hypertension: Secondary | ICD-10-CM | POA: Diagnosis not present

## 2016-10-01 DIAGNOSIS — Z0000 Encounter for general adult medical examination without abnormal findings: Secondary | ICD-10-CM | POA: Diagnosis not present

## 2016-10-01 DIAGNOSIS — Z6828 Body mass index (BMI) 28.0-28.9, adult: Secondary | ICD-10-CM | POA: Diagnosis not present

## 2016-10-01 DIAGNOSIS — E785 Hyperlipidemia, unspecified: Secondary | ICD-10-CM | POA: Diagnosis not present

## 2016-10-01 DIAGNOSIS — E559 Vitamin D deficiency, unspecified: Secondary | ICD-10-CM | POA: Diagnosis not present

## 2016-10-01 DIAGNOSIS — Z79899 Other long term (current) drug therapy: Secondary | ICD-10-CM | POA: Diagnosis not present

## 2016-12-03 DIAGNOSIS — H04123 Dry eye syndrome of bilateral lacrimal glands: Secondary | ICD-10-CM | POA: Diagnosis not present

## 2017-01-31 DIAGNOSIS — Z85828 Personal history of other malignant neoplasm of skin: Secondary | ICD-10-CM | POA: Diagnosis not present

## 2017-01-31 DIAGNOSIS — D2271 Melanocytic nevi of right lower limb, including hip: Secondary | ICD-10-CM | POA: Diagnosis not present

## 2017-01-31 DIAGNOSIS — D485 Neoplasm of uncertain behavior of skin: Secondary | ICD-10-CM | POA: Diagnosis not present

## 2017-01-31 DIAGNOSIS — L814 Other melanin hyperpigmentation: Secondary | ICD-10-CM | POA: Diagnosis not present

## 2017-01-31 DIAGNOSIS — L820 Inflamed seborrheic keratosis: Secondary | ICD-10-CM | POA: Diagnosis not present

## 2017-01-31 DIAGNOSIS — L570 Actinic keratosis: Secondary | ICD-10-CM | POA: Diagnosis not present

## 2017-01-31 DIAGNOSIS — D1801 Hemangioma of skin and subcutaneous tissue: Secondary | ICD-10-CM | POA: Diagnosis not present

## 2017-01-31 DIAGNOSIS — L821 Other seborrheic keratosis: Secondary | ICD-10-CM | POA: Diagnosis not present

## 2017-02-07 DIAGNOSIS — C44719 Basal cell carcinoma of skin of left lower limb, including hip: Secondary | ICD-10-CM | POA: Diagnosis not present

## 2017-02-07 DIAGNOSIS — D485 Neoplasm of uncertain behavior of skin: Secondary | ICD-10-CM | POA: Diagnosis not present

## 2017-03-04 DIAGNOSIS — Z1231 Encounter for screening mammogram for malignant neoplasm of breast: Secondary | ICD-10-CM | POA: Diagnosis not present

## 2017-03-07 DIAGNOSIS — L820 Inflamed seborrheic keratosis: Secondary | ICD-10-CM | POA: Diagnosis not present

## 2017-03-07 DIAGNOSIS — C44719 Basal cell carcinoma of skin of left lower limb, including hip: Secondary | ICD-10-CM | POA: Diagnosis not present

## 2017-03-29 DIAGNOSIS — Z961 Presence of intraocular lens: Secondary | ICD-10-CM | POA: Diagnosis not present

## 2017-03-29 DIAGNOSIS — H5212 Myopia, left eye: Secondary | ICD-10-CM | POA: Diagnosis not present

## 2017-04-13 DIAGNOSIS — N6489 Other specified disorders of breast: Secondary | ICD-10-CM | POA: Diagnosis not present

## 2017-04-13 DIAGNOSIS — N6311 Unspecified lump in the right breast, upper outer quadrant: Secondary | ICD-10-CM | POA: Diagnosis not present

## 2017-04-28 ENCOUNTER — Emergency Department (HOSPITAL_COMMUNITY)
Admission: EM | Admit: 2017-04-28 | Discharge: 2017-04-28 | Disposition: A | Discharge status: Home / Self Care | Payer: Medicare Other | Attending: Emergency Medicine | Admitting: Emergency Medicine

## 2017-04-28 ENCOUNTER — Emergency Department (HOSPITAL_COMMUNITY): Payer: Medicare Other

## 2017-04-28 ENCOUNTER — Encounter (HOSPITAL_COMMUNITY): Payer: Self-pay | Admitting: Emergency Medicine

## 2017-04-28 DIAGNOSIS — R197 Diarrhea, unspecified: Secondary | ICD-10-CM

## 2017-04-28 DIAGNOSIS — R109 Unspecified abdominal pain: Secondary | ICD-10-CM | POA: Insufficient documentation

## 2017-04-28 DIAGNOSIS — K5732 Diverticulitis of large intestine without perforation or abscess without bleeding: Secondary | ICD-10-CM | POA: Diagnosis not present

## 2017-04-28 DIAGNOSIS — E7800 Pure hypercholesterolemia, unspecified: Secondary | ICD-10-CM | POA: Insufficient documentation

## 2017-04-28 DIAGNOSIS — R112 Nausea with vomiting, unspecified: Secondary | ICD-10-CM | POA: Insufficient documentation

## 2017-04-28 DIAGNOSIS — K5730 Diverticulosis of large intestine without perforation or abscess without bleeding: Secondary | ICD-10-CM | POA: Diagnosis not present

## 2017-04-28 DIAGNOSIS — R079 Chest pain, unspecified: Secondary | ICD-10-CM | POA: Diagnosis not present

## 2017-04-28 LAB — CBC
HCT: 43 % (ref 36.0–46.0)
Hemoglobin: 14.4 g/dL (ref 12.0–15.0)
MCH: 30.1 pg (ref 26.0–34.0)
MCHC: 33.5 g/dL (ref 30.0–36.0)
MCV: 89.8 fL (ref 78.0–100.0)
Platelets: 302 10*3/uL (ref 150–400)
RBC: 4.79 MIL/uL (ref 3.87–5.11)
RDW: 13.2 % (ref 11.5–15.5)
WBC: 8.8 10*3/uL (ref 4.0–10.5)

## 2017-04-28 LAB — COMPREHENSIVE METABOLIC PANEL
ALT: 26 U/L (ref 14–54)
AST: 35 U/L (ref 15–41)
Albumin: 4 g/dL (ref 3.5–5.0)
Alkaline Phosphatase: 78 U/L (ref 38–126)
Anion gap: 10 (ref 5–15)
BUN: 14 mg/dL (ref 6–20)
CO2: 24 mmol/L (ref 22–32)
CREATININE: 1.03 mg/dL — AB (ref 0.44–1.00)
Calcium: 9.1 mg/dL (ref 8.9–10.3)
Chloride: 106 mmol/L (ref 101–111)
GFR calc Af Amer: 60 mL/min — ABNORMAL LOW (ref >60)
GFR calc non Af Amer: 51 mL/min — ABNORMAL LOW (ref >60)
Glucose, Bld: 141 mg/dL — ABNORMAL HIGH (ref 65–99)
Potassium: 4 mmol/L (ref 3.5–5.1)
SODIUM: 140 mmol/L (ref 135–145)
Total Bilirubin: 1.4 mg/dL — ABNORMAL HIGH (ref 0.3–1.2)
Total Protein: 7 g/dL (ref 6.5–8.1)

## 2017-04-28 LAB — URINALYSIS, ROUTINE W REFLEX MICROSCOPIC
Bilirubin Urine: NEGATIVE
GLUCOSE, UA: NEGATIVE mg/dL
HGB URINE DIPSTICK: NEGATIVE
Ketones, ur: NEGATIVE mg/dL
Leukocytes, UA: NEGATIVE
Nitrite: NEGATIVE
PROTEIN: NEGATIVE mg/dL
Specific Gravity, Urine: 1.019 (ref 1.005–1.030)
pH: 5 (ref 5.0–8.0)

## 2017-04-28 LAB — I-STAT TROPONIN, ED: Troponin i, poc: 0 ng/mL (ref 0.00–0.08)

## 2017-04-28 LAB — LIPASE, BLOOD: LIPASE: 24 U/L (ref 11–51)

## 2017-04-28 MED ORDER — AMOXICILLIN-POT CLAVULANATE 875-125 MG PO TABS
1.0000 | ORAL_TABLET | Freq: Once | ORAL | Status: AC
  Administered 2017-04-28: 1 via ORAL
  Filled 2017-04-28: qty 1

## 2017-04-28 MED ORDER — SODIUM CHLORIDE 0.9 % IV BOLUS (SEPSIS)
1000.0000 mL | Freq: Once | INTRAVENOUS | Status: AC
  Administered 2017-04-28: 1000 mL via INTRAVENOUS

## 2017-04-28 MED ORDER — AMOXICILLIN-POT CLAVULANATE 875-125 MG PO TABS
1.0000 | ORAL_TABLET | Freq: Two times a day (BID) | ORAL | 0 refills | Status: AC
Start: 2017-04-28 — End: ?

## 2017-04-28 MED ORDER — ONDANSETRON 4 MG PO TBDP
4.0000 mg | ORAL_TABLET | Freq: Once | ORAL | Status: AC | PRN
  Administered 2017-04-28: 4 mg via ORAL

## 2017-04-28 MED ORDER — ONDANSETRON 4 MG PO TBDP
ORAL_TABLET | ORAL | Status: AC
  Filled 2017-04-28: qty 1

## 2017-04-28 MED ORDER — IOPAMIDOL (ISOVUE-300) INJECTION 61%
INTRAVENOUS | Status: AC
  Administered 2017-04-28: 100 mL
  Filled 2017-04-28: qty 100

## 2017-04-28 NOTE — Discharge Instructions (Signed)
It was our pleasure to provide your ER care today - we hope that you feel better.  Rest. Drink plenty of fluids.  Take augmentin (antibiotic) as prescribed.   Take acetaminophen and/or ibuprofen as need.  For the chronic abdominal pain, follow up with your doctor and with your GI doctor in the next couple weeks.     Return to ER if worse, persistent vomiting, severe abdominal pain, other concern.

## 2017-04-28 NOTE — ED Triage Notes (Signed)
Onset one day ago developed epigastric pain with nausea, emesis, and diarrhea. States pain radiating to chest however denies chest pain now. Abdominal pain 7/10 achy.

## 2017-04-28 NOTE — ED Provider Notes (Signed)
[facility] DEPT Provider Note   CSN: [id number] Arrival date & time: 04/28/17  2585     History   Chief Complaint Chief Complaint  Patient presents with  . Abdominal Pain    HPI Kara Villa is a 76 y.o. female.  Kara Villa is a 76 yo F with Hx of HLD presenting with nausea and vomiting starting this morning. She states she has been feeling tired for the past couple of days. She awoke around 12 am feeling nausea, brief episode of chest pain radiating to her back and subsequently experienced 3 episodes of vomiting and 3 episodes of diarrhea. Her last episode of vomiting was around 7 am and she has had decreased nausea since that time. She did eat out a couple of days ago and had a chicken salad sandwich, but did not have nausea until this morning. She has chronic abdominal pain tender to palpation at the RUQ for the past year, which has not changed recently. She had a cholecystectomy over a decade ago and states she had a colonoscopy last year, which was normal.     Abdominal Pain   Pertinent negatives include fever, dysuria, hematuria and arthralgias.    Past Medical History:  Diagnosis Date  . High cholesterol     There are no active problems to display for this patient.   Past Surgical History:  Procedure Laterality Date  . CHOLECYSTECTOMY      OB History    No data available       Home Medications    Prior to Admission medications   Medication Sig Start Date End Date Taking? Authorizing Provider  calcium citrate-vitamin D (CITRACAL+D) 315-200 MG-UNIT per tablet Take 1 tablet by mouth 2 (two) times daily.    [provider]  Cholecalciferol (VITAMIN D3) 2000 UNITS capsule Take 2,000 Units by mouth daily.    [provider]  ondansetron (ZOFRAN) 4 MG tablet Take 1 tablet (4 mg total) by mouth every 6 (six) hours. 12/09/12   Hess, Hessie Diener, PA-C  Red Yeast Rice 600 MG CAPS Take 1 capsule by mouth 2 (two) times daily.    [provider]    Family History No family history on file.  Social History Social History  Substance Use Topics  . Smoking status: Never Smoker  . Smokeless tobacco: Never Used  . Alcohol use No     Allergies   Azithromycin   Review of Systems Review of Systems  Constitutional: Negative for chills and fever.  HENT: Negative for ear pain and sore throat.   Eyes: Negative for pain and visual disturbance.  Respiratory: Negative for cough and shortness of breath.   Cardiovascular: Negative for chest pain and palpitations.  Gastrointestinal: Positive for abdominal pain.  Genitourinary: Negative for dysuria and hematuria.  Musculoskeletal: Negative for arthralgias and back pain.  Skin: Negative for color change and rash.  Neurological: Negative for seizures and syncope.  All other systems reviewed and are negative.    Physical Exam Updated Vital Signs BP 108/67   Pulse 82   Temp 98.3 F (36.8 C) (Oral)   Resp 16   Ht 5\' 1"  (1.549 m)   Wt 63.5 kg (140 lb)   SpO2 95%   BMI 26.45 kg/m   Physical Exam  Constitutional: She is oriented to person, place, and time. She appears well-developed and well-nourished. No distress.  HENT:  Head: Normocephalic and atraumatic.  Eyes: Conjunctivae are normal.  Neck: Neck supple.  Cardiovascular: Normal  rate and regular rhythm.   No murmur heard. Pulmonary/Chest: Effort normal and breath sounds normal. No respiratory distress.  Abdominal: Soft. There is no tenderness.  RUQ tender to palpation  Musculoskeletal: She exhibits no edema.  Neurological: She is alert and oriented to person, place, and time.  Skin: Skin is warm and dry.  Psychiatric: She has a normal mood and affect.  Nursing note and vitals reviewed.    ED Treatments / Results  Labs (all labs ordered are listed, but only abnormal results are displayed) Labs Reviewed  COMPREHENSIVE METABOLIC PANEL - Abnormal; Notable for the following:       Result Value   Glucose, Bld  141 (*)    Creatinine, Ser 1.03 (*)    Total Bilirubin 1.4 (*)    GFR calc non Af Amer 51 (*)    GFR calc Af Amer 60 (*)    All other components within normal limits  URINALYSIS, ROUTINE W REFLEX MICROSCOPIC - Abnormal; Notable for the following:    APPearance HAZY (*)    All other components within normal limits  LIPASE, BLOOD  CBC  I-STAT TROPONIN, ED    EKG  EKG Interpretation  Date/Time:  Thursday April 28 2017 08:38:46 EDT Ventricular Rate:  104 PR Interval:  128 QRS Duration: 72 QT Interval:  356 QTC Calculation: 468 R Axis:   61 Text Interpretation:  Sinus tachycardia Low voltage QRS Confirmed by Lajean Saver [phone]) on 04/28/2017 10:10:58 AM       Radiology Dg Chest 2 View  Result Date: 04/28/2017 CLINICAL DATA:  one day ago developed epigastric pain with nausea, emesis, and diarrhea. States pain radiating to chest however denies chest pain now. EXAM: CHEST  2 VIEW COMPARISON:  Chest x-ray dated 12/09/2012. FINDINGS: The heart size and mediastinal contours are within normal limits. Both lungs are clear. No pleural effusion or pneumothorax seen. The visualized skeletal structures are unremarkable. IMPRESSION: No active cardiopulmonary disease. No evidence of pneumonia or pulmonary edema. Electronically Signed   By: Franki Cabot M.D.   On: 04/28/2017 09:09    Procedures Procedures (including critical care time)  Medications Ordered in ED Medications  ondansetron (ZOFRAN-ODT) 4 MG disintegrating tablet (not administered)  iopamidol (ISOVUE-300) 61 % injection (not administered)  ondansetron (ZOFRAN-ODT) disintegrating tablet 4 mg (4 mg Oral Given 04/28/17 0841)  sodium chloride 0.9 % bolus 1,000 mL (1,000 mLs Intravenous New Bag/Given 04/28/17 1135)    Initial Impression / Assessment and Plan / ED Course  I have reviewed the triage vital signs and the nursing notes.  Pertinent labs & imaging results that were available during my care of the patient were reviewed by  me and considered in my medical decision making (see chart for details).    Lipase negative making pancreatitis unlikely, Troponin negative decreasing concern for cardiac origin of brief chest pain this morning. CBC was WNL and CMP shows ARF in the setting of vomiting and diarrhea. Urinalysis negative. CXR negative for active cardiopulmonary disease. EKG showing Sinus tachycardia.  Suspect viral gastroenteritis given self limiting vomiting and diarrhea. Patient able to tolerate liquids. CT Abd/Pelvis pending for her chronic abdominal pain. Patient left with Dr. Ashok Cordia.   Final Clinical Impressions(s) / ED Diagnoses   Final diagnoses:  None    New Prescriptions New Prescriptions   No medications on file     Neva Seat, MD 04/28/17 1210    Lajean Saver, MD 04/28/17 1314

## 2017-05-05 DIAGNOSIS — K5792 Diverticulitis of intestine, part unspecified, without perforation or abscess without bleeding: Secondary | ICD-10-CM | POA: Diagnosis not present

## 2017-06-01 DIAGNOSIS — K5792 Diverticulitis of intestine, part unspecified, without perforation or abscess without bleeding: Secondary | ICD-10-CM | POA: Diagnosis not present

## 2017-06-21 DIAGNOSIS — Z23 Encounter for immunization: Secondary | ICD-10-CM | POA: Diagnosis not present

## 2017-08-01 DIAGNOSIS — K5792 Diverticulitis of intestine, part unspecified, without perforation or abscess without bleeding: Secondary | ICD-10-CM | POA: Diagnosis not present

## 2017-08-01 DIAGNOSIS — Z86010 Personal history of colonic polyps: Secondary | ICD-10-CM | POA: Diagnosis not present

## 2017-11-03 DIAGNOSIS — K219 Gastro-esophageal reflux disease without esophagitis: Secondary | ICD-10-CM | POA: Diagnosis not present

## 2017-11-03 DIAGNOSIS — Z0000 Encounter for general adult medical examination without abnormal findings: Secondary | ICD-10-CM | POA: Diagnosis not present

## 2017-11-03 DIAGNOSIS — Z1389 Encounter for screening for other disorder: Secondary | ICD-10-CM | POA: Diagnosis not present

## 2017-11-03 DIAGNOSIS — E785 Hyperlipidemia, unspecified: Secondary | ICD-10-CM | POA: Diagnosis not present

## 2017-11-03 DIAGNOSIS — Z6827 Body mass index (BMI) 27.0-27.9, adult: Secondary | ICD-10-CM | POA: Diagnosis not present

## 2017-11-03 DIAGNOSIS — E559 Vitamin D deficiency, unspecified: Secondary | ICD-10-CM | POA: Diagnosis not present

## 2017-11-03 DIAGNOSIS — M899 Disorder of bone, unspecified: Secondary | ICD-10-CM | POA: Diagnosis not present

## 2018-01-03 DIAGNOSIS — E2839 Other primary ovarian failure: Secondary | ICD-10-CM | POA: Diagnosis not present

## 2018-01-03 DIAGNOSIS — M8588 Other specified disorders of bone density and structure, other site: Secondary | ICD-10-CM | POA: Diagnosis not present

## 2018-02-06 DIAGNOSIS — L821 Other seborrheic keratosis: Secondary | ICD-10-CM | POA: Diagnosis not present

## 2018-02-06 DIAGNOSIS — L853 Xerosis cutis: Secondary | ICD-10-CM | POA: Diagnosis not present

## 2018-02-06 DIAGNOSIS — L989 Disorder of the skin and subcutaneous tissue, unspecified: Secondary | ICD-10-CM | POA: Diagnosis not present

## 2018-02-06 DIAGNOSIS — Z85828 Personal history of other malignant neoplasm of skin: Secondary | ICD-10-CM | POA: Diagnosis not present

## 2018-02-06 DIAGNOSIS — D485 Neoplasm of uncertain behavior of skin: Secondary | ICD-10-CM | POA: Diagnosis not present

## 2018-02-14 DIAGNOSIS — W19XXXA Unspecified fall, initial encounter: Secondary | ICD-10-CM | POA: Diagnosis not present

## 2018-02-14 DIAGNOSIS — S20212A Contusion of left front wall of thorax, initial encounter: Secondary | ICD-10-CM | POA: Diagnosis not present

## 2018-04-04 DIAGNOSIS — Z961 Presence of intraocular lens: Secondary | ICD-10-CM | POA: Diagnosis not present

## 2018-04-04 DIAGNOSIS — H0015 Chalazion left lower eyelid: Secondary | ICD-10-CM | POA: Diagnosis not present

## 2018-06-09 DIAGNOSIS — Z23 Encounter for immunization: Secondary | ICD-10-CM | POA: Diagnosis not present

## 2018-08-21 DIAGNOSIS — D225 Melanocytic nevi of trunk: Secondary | ICD-10-CM | POA: Diagnosis not present

## 2018-08-21 DIAGNOSIS — Z85828 Personal history of other malignant neoplasm of skin: Secondary | ICD-10-CM | POA: Diagnosis not present

## 2018-08-21 DIAGNOSIS — D1801 Hemangioma of skin and subcutaneous tissue: Secondary | ICD-10-CM | POA: Diagnosis not present

## 2018-08-21 DIAGNOSIS — L821 Other seborrheic keratosis: Secondary | ICD-10-CM | POA: Diagnosis not present

## 2018-08-21 DIAGNOSIS — L814 Other melanin hyperpigmentation: Secondary | ICD-10-CM | POA: Diagnosis not present

## 2018-08-21 DIAGNOSIS — L820 Inflamed seborrheic keratosis: Secondary | ICD-10-CM | POA: Diagnosis not present

## 2018-09-04 DIAGNOSIS — M25561 Pain in right knee: Secondary | ICD-10-CM | POA: Diagnosis not present

## 2018-11-13 IMAGING — DX DG CHEST 2V
2 series · 2 of 2 positions shown · non-contrast
Comparison: Chest x-ray dated 12/09/2012.

CLINICAL DATA: one day ago developed epigastric pain with nausea,
emesis, and diarrhea. States pain radiating to chest however denies
chest pain now.

EXAM:
CHEST  2 VIEW

[chest pa]
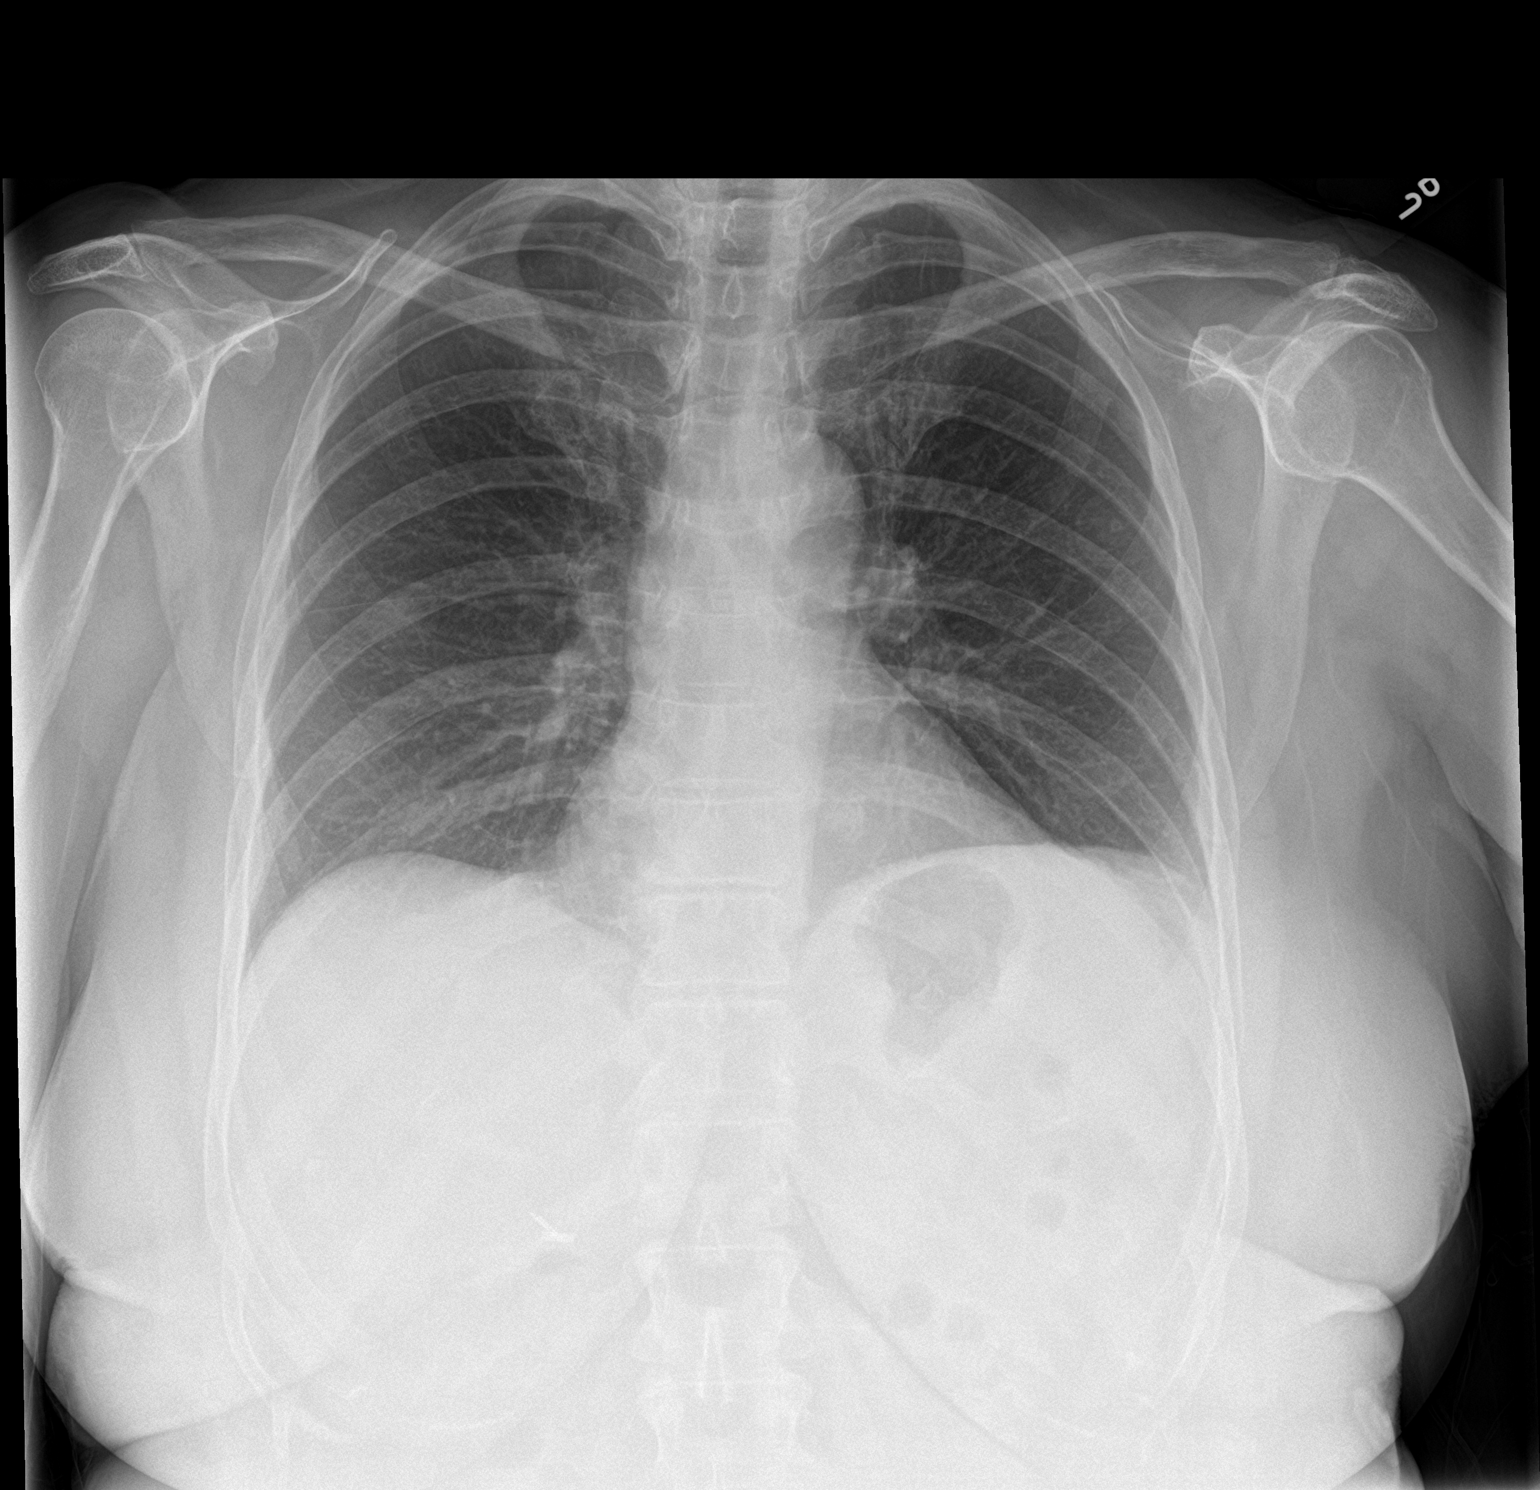

[chest lat]
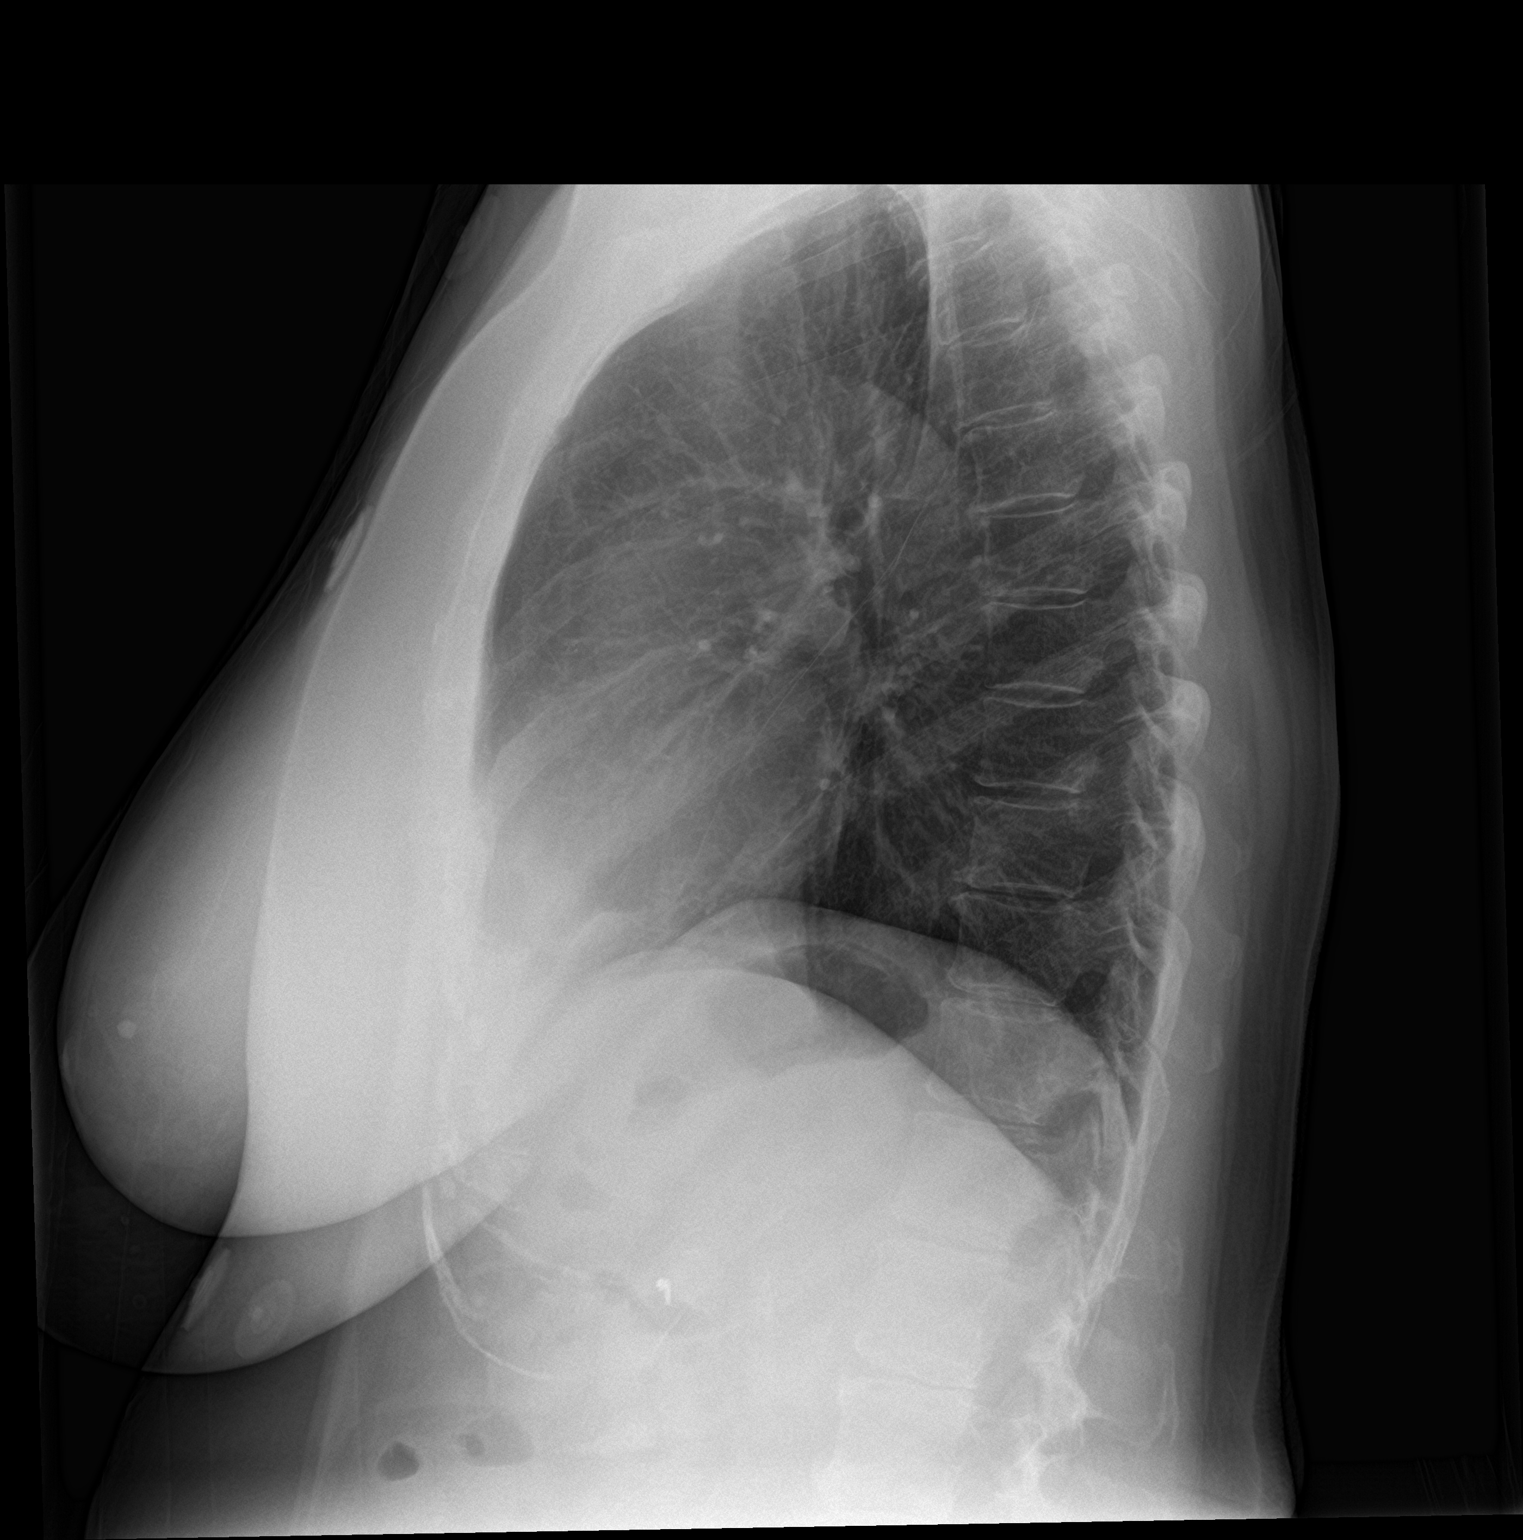

[2 of 2 positions shown; findings below may reference images not displayed]

FINDINGS: The heart size and mediastinal contours are within normal limits.
Both lungs are clear. No pleural effusion or pneumothorax seen. The
visualized skeletal structures are unremarkable.
IMPRESSION: No active cardiopulmonary disease. No evidence of pneumonia or
pulmonary edema.

## 2019-04-09 DIAGNOSIS — M899 Disorder of bone, unspecified: Secondary | ICD-10-CM | POA: Diagnosis not present

## 2019-04-09 DIAGNOSIS — Z79899 Other long term (current) drug therapy: Secondary | ICD-10-CM | POA: Diagnosis not present

## 2019-04-09 DIAGNOSIS — I10 Essential (primary) hypertension: Secondary | ICD-10-CM | POA: Diagnosis not present

## 2019-04-09 DIAGNOSIS — E559 Vitamin D deficiency, unspecified: Secondary | ICD-10-CM | POA: Diagnosis not present

## 2019-04-09 DIAGNOSIS — E785 Hyperlipidemia, unspecified: Secondary | ICD-10-CM | POA: Diagnosis not present

## 2019-04-09 DIAGNOSIS — H938X9 Other specified disorders of ear, unspecified ear: Secondary | ICD-10-CM | POA: Diagnosis not present

## 2019-04-09 DIAGNOSIS — H409 Unspecified glaucoma: Secondary | ICD-10-CM | POA: Diagnosis not present

## 2019-04-09 DIAGNOSIS — K219 Gastro-esophageal reflux disease without esophagitis: Secondary | ICD-10-CM | POA: Diagnosis not present

## 2019-04-09 DIAGNOSIS — Z0000 Encounter for general adult medical examination without abnormal findings: Secondary | ICD-10-CM | POA: Diagnosis not present

## 2019-04-09 DIAGNOSIS — C4491 Basal cell carcinoma of skin, unspecified: Secondary | ICD-10-CM | POA: Diagnosis not present

## 2019-04-11 DIAGNOSIS — Z961 Presence of intraocular lens: Secondary | ICD-10-CM | POA: Diagnosis not present

## 2019-04-11 DIAGNOSIS — H35363 Drusen (degenerative) of macula, bilateral: Secondary | ICD-10-CM | POA: Diagnosis not present

## 2019-04-24 DIAGNOSIS — H6123 Impacted cerumen, bilateral: Secondary | ICD-10-CM | POA: Diagnosis not present

## 2019-06-02 DIAGNOSIS — Z23 Encounter for immunization: Secondary | ICD-10-CM | POA: Diagnosis not present

## 2019-06-07 DIAGNOSIS — S80869A Insect bite (nonvenomous), unspecified lower leg, initial encounter: Secondary | ICD-10-CM | POA: Diagnosis not present

## 2019-06-07 DIAGNOSIS — W57XXXA Bitten or stung by nonvenomous insect and other nonvenomous arthropods, initial encounter: Secondary | ICD-10-CM | POA: Diagnosis not present

## 2019-07-13 DIAGNOSIS — Z20828 Contact with and (suspected) exposure to other viral communicable diseases: Secondary | ICD-10-CM | POA: Diagnosis not present

## 2019-08-28 DIAGNOSIS — L821 Other seborrheic keratosis: Secondary | ICD-10-CM | POA: Diagnosis not present

## 2019-08-28 DIAGNOSIS — D1801 Hemangioma of skin and subcutaneous tissue: Secondary | ICD-10-CM | POA: Diagnosis not present

## 2019-08-28 DIAGNOSIS — L820 Inflamed seborrheic keratosis: Secondary | ICD-10-CM | POA: Diagnosis not present

## 2019-08-28 DIAGNOSIS — D225 Melanocytic nevi of trunk: Secondary | ICD-10-CM | POA: Diagnosis not present

## 2019-08-28 DIAGNOSIS — Z85828 Personal history of other malignant neoplasm of skin: Secondary | ICD-10-CM | POA: Diagnosis not present

## 2019-08-28 DIAGNOSIS — D485 Neoplasm of uncertain behavior of skin: Secondary | ICD-10-CM | POA: Diagnosis not present

## 2019-08-28 DIAGNOSIS — L570 Actinic keratosis: Secondary | ICD-10-CM | POA: Diagnosis not present

## 2019-08-28 DIAGNOSIS — D2271 Melanocytic nevi of right lower limb, including hip: Secondary | ICD-10-CM | POA: Diagnosis not present

## 2019-10-15 ENCOUNTER — Ambulatory Visit: Payer: Medicare Other

## 2019-10-16 ENCOUNTER — Ambulatory Visit: Payer: Medicare Other | Attending: Internal Medicine

## 2019-10-16 DIAGNOSIS — Z23 Encounter for immunization: Secondary | ICD-10-CM | POA: Insufficient documentation

## 2019-10-16 NOTE — Progress Notes (Signed)
   Covid-19 Vaccination Clinic  Name:  SHARMON HUNNEWELL    MRN: [id number] DOB: 12-28-1940  10/16/2019  Ms. Meredith was observed post Covid-19 immunization for 15 minutes without incidence. She was provided with Vaccine Information Sheet and instruction to access the V-Safe system.   Ms. Atienza was instructed to call 911 with any severe reactions post vaccine: Marland Kitchen Difficulty breathing  . Swelling of your face and throat  . A fast heartbeat  . A bad rash all over your body  . Dizziness and weakness    Immunizations Administered    Name Date Dose VIS Date Route   Pfizer COVID-19 Vaccine 10/16/2019  3:22 PM 0.3 mL 09/07/2019 Intramuscular   Manufacturer: [place]   Lot: [id number]   NDC: [id number]

## 2019-10-25 ENCOUNTER — Ambulatory Visit: Payer: Medicare Other

## 2019-11-05 ENCOUNTER — Ambulatory Visit: Payer: Medicare Other

## 2019-11-05 ENCOUNTER — Ambulatory Visit: Payer: Medicare Other | Attending: Internal Medicine

## 2019-11-05 DIAGNOSIS — Z23 Encounter for immunization: Secondary | ICD-10-CM

## 2019-11-05 NOTE — Progress Notes (Signed)
   Covid-19 Vaccination Clinic  Name:  Kara Villa    MRN: [id number] DOB: [birth year]  11/05/2019  [name] was observed post Covid-19 immunization for 15 minutes without incidence. She was provided with Vaccine Information Sheet and instruction to access the V-Safe system.   [name] was instructed to call 911 with any severe reactions post vaccine: Marland Kitchen Difficulty breathing  . Swelling of your face and throat  . A fast heartbeat  . A bad rash all over your body  . Dizziness and weakness    Immunizations Administered    Name Date Dose VIS Date Route   Pfizer COVID-19 Vaccine 11/05/2019  5:48 PM 0.3 mL 09/07/2019 Intramuscular   Manufacturer: [place]   Lot: [id number]   [place]: [id number]

## 2019-11-13 DIAGNOSIS — H938X3 Other specified disorders of ear, bilateral: Secondary | ICD-10-CM | POA: Diagnosis not present

## 2019-11-13 DIAGNOSIS — H9113 Presbycusis, bilateral: Secondary | ICD-10-CM | POA: Diagnosis not present

## 2020-01-22 DIAGNOSIS — Z1231 Encounter for screening mammogram for malignant neoplasm of breast: Secondary | ICD-10-CM | POA: Diagnosis not present

## 2020-02-06 DIAGNOSIS — J019 Acute sinusitis, unspecified: Secondary | ICD-10-CM | POA: Diagnosis not present

## 2020-02-26 DIAGNOSIS — L905 Scar conditions and fibrosis of skin: Secondary | ICD-10-CM | POA: Diagnosis not present

## 2020-02-26 DIAGNOSIS — L821 Other seborrheic keratosis: Secondary | ICD-10-CM | POA: Diagnosis not present

## 2020-02-26 DIAGNOSIS — C44519 Basal cell carcinoma of skin of other part of trunk: Secondary | ICD-10-CM | POA: Diagnosis not present

## 2020-02-26 DIAGNOSIS — D1801 Hemangioma of skin and subcutaneous tissue: Secondary | ICD-10-CM | POA: Diagnosis not present

## 2020-02-26 DIAGNOSIS — D225 Melanocytic nevi of trunk: Secondary | ICD-10-CM | POA: Diagnosis not present

## 2020-02-26 DIAGNOSIS — D485 Neoplasm of uncertain behavior of skin: Secondary | ICD-10-CM | POA: Diagnosis not present

## 2020-02-26 DIAGNOSIS — Z85828 Personal history of other malignant neoplasm of skin: Secondary | ICD-10-CM | POA: Diagnosis not present

## 2020-02-26 DIAGNOSIS — L570 Actinic keratosis: Secondary | ICD-10-CM | POA: Diagnosis not present

## 2020-02-26 DIAGNOSIS — L820 Inflamed seborrheic keratosis: Secondary | ICD-10-CM | POA: Diagnosis not present

## 2020-04-22 DIAGNOSIS — Z0000 Encounter for general adult medical examination without abnormal findings: Secondary | ICD-10-CM | POA: Diagnosis not present

## 2020-04-22 DIAGNOSIS — E785 Hyperlipidemia, unspecified: Secondary | ICD-10-CM | POA: Diagnosis not present

## 2020-04-22 DIAGNOSIS — M899 Disorder of bone, unspecified: Secondary | ICD-10-CM | POA: Diagnosis not present

## 2020-04-22 DIAGNOSIS — E559 Vitamin D deficiency, unspecified: Secondary | ICD-10-CM | POA: Diagnosis not present

## 2020-04-22 DIAGNOSIS — K219 Gastro-esophageal reflux disease without esophagitis: Secondary | ICD-10-CM | POA: Diagnosis not present

## 2020-06-17 DIAGNOSIS — Z23 Encounter for immunization: Secondary | ICD-10-CM | POA: Diagnosis not present

## 2020-07-08 DIAGNOSIS — Z23 Encounter for immunization: Secondary | ICD-10-CM | POA: Diagnosis not present

## 2020-09-01 DIAGNOSIS — D1801 Hemangioma of skin and subcutaneous tissue: Secondary | ICD-10-CM | POA: Diagnosis not present

## 2020-09-01 DIAGNOSIS — Z85828 Personal history of other malignant neoplasm of skin: Secondary | ICD-10-CM | POA: Diagnosis not present

## 2020-09-01 DIAGNOSIS — D225 Melanocytic nevi of trunk: Secondary | ICD-10-CM | POA: Diagnosis not present

## 2020-09-01 DIAGNOSIS — L814 Other melanin hyperpigmentation: Secondary | ICD-10-CM | POA: Diagnosis not present

## 2020-09-01 DIAGNOSIS — L905 Scar conditions and fibrosis of skin: Secondary | ICD-10-CM | POA: Diagnosis not present

## 2020-09-01 DIAGNOSIS — L820 Inflamed seborrheic keratosis: Secondary | ICD-10-CM | POA: Diagnosis not present

## 2020-09-01 DIAGNOSIS — L821 Other seborrheic keratosis: Secondary | ICD-10-CM | POA: Diagnosis not present

## 2020-11-14 DIAGNOSIS — Z961 Presence of intraocular lens: Secondary | ICD-10-CM | POA: Diagnosis not present

## 2020-11-14 DIAGNOSIS — H5212 Myopia, left eye: Secondary | ICD-10-CM | POA: Diagnosis not present

## 2020-12-09 DIAGNOSIS — E2839 Other primary ovarian failure: Secondary | ICD-10-CM | POA: Diagnosis not present

## 2020-12-09 DIAGNOSIS — S6992XA Unspecified injury of left wrist, hand and finger(s), initial encounter: Secondary | ICD-10-CM | POA: Diagnosis not present

## 2020-12-09 DIAGNOSIS — R221 Localized swelling, mass and lump, neck: Secondary | ICD-10-CM | POA: Diagnosis not present

## 2021-03-02 DIAGNOSIS — L981 Factitial dermatitis: Secondary | ICD-10-CM | POA: Diagnosis not present

## 2021-03-02 DIAGNOSIS — L814 Other melanin hyperpigmentation: Secondary | ICD-10-CM | POA: Diagnosis not present

## 2021-03-02 DIAGNOSIS — L919 Hypertrophic disorder of the skin, unspecified: Secondary | ICD-10-CM | POA: Diagnosis not present

## 2021-03-02 DIAGNOSIS — L821 Other seborrheic keratosis: Secondary | ICD-10-CM | POA: Diagnosis not present

## 2021-03-02 DIAGNOSIS — D1801 Hemangioma of skin and subcutaneous tissue: Secondary | ICD-10-CM | POA: Diagnosis not present

## 2021-03-09 DIAGNOSIS — Z23 Encounter for immunization: Secondary | ICD-10-CM | POA: Diagnosis not present

## 2021-04-02 DIAGNOSIS — Z1231 Encounter for screening mammogram for malignant neoplasm of breast: Secondary | ICD-10-CM | POA: Diagnosis not present

## 2021-05-07 ENCOUNTER — Ambulatory Visit
Admission: RE | Admit: 2021-05-07 | Discharge: 2021-05-07 | Disposition: A | Discharge status: Home / Self Care | Payer: Medicare Other | Source: Ambulatory Visit | Attending: Family Medicine | Admitting: Family Medicine

## 2021-05-07 ENCOUNTER — Other Ambulatory Visit: Payer: Self-pay | Admitting: Family Medicine

## 2021-05-07 ENCOUNTER — Other Ambulatory Visit: Payer: Self-pay

## 2021-05-07 DIAGNOSIS — R0989 Other specified symptoms and signs involving the circulatory and respiratory systems: Secondary | ICD-10-CM | POA: Diagnosis not present

## 2021-05-07 DIAGNOSIS — M899 Disorder of bone, unspecified: Secondary | ICD-10-CM | POA: Diagnosis not present

## 2021-05-07 DIAGNOSIS — E785 Hyperlipidemia, unspecified: Secondary | ICD-10-CM | POA: Diagnosis not present

## 2021-05-07 DIAGNOSIS — R030 Elevated blood-pressure reading, without diagnosis of hypertension: Secondary | ICD-10-CM | POA: Diagnosis not present

## 2021-05-07 DIAGNOSIS — E559 Vitamin D deficiency, unspecified: Secondary | ICD-10-CM | POA: Diagnosis not present

## 2021-05-07 DIAGNOSIS — Z0000 Encounter for general adult medical examination without abnormal findings: Secondary | ICD-10-CM | POA: Diagnosis not present

## 2021-05-07 DIAGNOSIS — R946 Abnormal results of thyroid function studies: Secondary | ICD-10-CM | POA: Diagnosis not present

## 2021-05-07 DIAGNOSIS — K219 Gastro-esophageal reflux disease without esophagitis: Secondary | ICD-10-CM | POA: Diagnosis not present

## 2021-05-07 DIAGNOSIS — Z23 Encounter for immunization: Secondary | ICD-10-CM | POA: Diagnosis not present

## 2021-05-08 ENCOUNTER — Other Ambulatory Visit: Payer: Self-pay | Admitting: Family Medicine

## 2021-05-08 DIAGNOSIS — E2839 Other primary ovarian failure: Secondary | ICD-10-CM

## 2021-06-18 DIAGNOSIS — Z23 Encounter for immunization: Secondary | ICD-10-CM | POA: Diagnosis not present

## 2021-09-01 DIAGNOSIS — Z789 Other specified health status: Secondary | ICD-10-CM | POA: Diagnosis not present

## 2021-09-01 DIAGNOSIS — D1801 Hemangioma of skin and subcutaneous tissue: Secondary | ICD-10-CM | POA: Diagnosis not present

## 2021-09-01 DIAGNOSIS — L821 Other seborrheic keratosis: Secondary | ICD-10-CM | POA: Diagnosis not present

## 2021-09-01 DIAGNOSIS — L814 Other melanin hyperpigmentation: Secondary | ICD-10-CM | POA: Diagnosis not present

## 2021-09-01 DIAGNOSIS — L538 Other specified erythematous conditions: Secondary | ICD-10-CM | POA: Diagnosis not present

## 2021-09-01 DIAGNOSIS — L84 Corns and callosities: Secondary | ICD-10-CM | POA: Diagnosis not present

## 2021-09-01 DIAGNOSIS — L820 Inflamed seborrheic keratosis: Secondary | ICD-10-CM | POA: Diagnosis not present

## 2021-10-22 DIAGNOSIS — Z86010 Personal history of colonic polyps: Secondary | ICD-10-CM | POA: Diagnosis not present

## 2021-10-22 DIAGNOSIS — K649 Unspecified hemorrhoids: Secondary | ICD-10-CM | POA: Diagnosis not present

## 2021-10-29 DIAGNOSIS — E038 Other specified hypothyroidism: Secondary | ICD-10-CM | POA: Diagnosis not present

## 2021-10-29 DIAGNOSIS — M25552 Pain in left hip: Secondary | ICD-10-CM | POA: Diagnosis not present

## 2021-11-02 ENCOUNTER — Ambulatory Visit
Admission: RE | Admit: 2021-11-02 | Discharge: 2021-11-02 | Disposition: A | Discharge status: Home / Self Care | Payer: Medicare Other | Source: Ambulatory Visit | Attending: Family Medicine | Admitting: Family Medicine

## 2021-11-02 DIAGNOSIS — Z780 Asymptomatic menopausal state: Secondary | ICD-10-CM | POA: Diagnosis not present

## 2021-11-02 DIAGNOSIS — M85851 Other specified disorders of bone density and structure, right thigh: Secondary | ICD-10-CM | POA: Diagnosis not present

## 2021-11-02 DIAGNOSIS — E2839 Other primary ovarian failure: Secondary | ICD-10-CM

## 2021-11-18 DIAGNOSIS — Z961 Presence of intraocular lens: Secondary | ICD-10-CM | POA: Diagnosis not present

## 2021-11-26 DIAGNOSIS — R051 Acute cough: Secondary | ICD-10-CM | POA: Diagnosis not present

## 2021-11-26 DIAGNOSIS — Z03818 Encounter for observation for suspected exposure to other biological agents ruled out: Secondary | ICD-10-CM | POA: Diagnosis not present

## 2021-11-27 DIAGNOSIS — M25552 Pain in left hip: Secondary | ICD-10-CM | POA: Diagnosis not present

## 2021-11-27 DIAGNOSIS — J069 Acute upper respiratory infection, unspecified: Secondary | ICD-10-CM | POA: Diagnosis not present

## 2021-12-02 DIAGNOSIS — M25552 Pain in left hip: Secondary | ICD-10-CM | POA: Diagnosis not present

## 2021-12-09 DIAGNOSIS — M25552 Pain in left hip: Secondary | ICD-10-CM | POA: Diagnosis not present

## 2021-12-11 DIAGNOSIS — M25552 Pain in left hip: Secondary | ICD-10-CM | POA: Diagnosis not present

## 2021-12-15 DIAGNOSIS — M25552 Pain in left hip: Secondary | ICD-10-CM | POA: Diagnosis not present

## 2021-12-17 DIAGNOSIS — M25552 Pain in left hip: Secondary | ICD-10-CM | POA: Diagnosis not present

## 2021-12-22 DIAGNOSIS — M25552 Pain in left hip: Secondary | ICD-10-CM | POA: Diagnosis not present

## 2021-12-25 DIAGNOSIS — M25552 Pain in left hip: Secondary | ICD-10-CM | POA: Diagnosis not present

## 2021-12-29 DIAGNOSIS — M25552 Pain in left hip: Secondary | ICD-10-CM | POA: Diagnosis not present

## 2021-12-31 DIAGNOSIS — M1612 Unilateral primary osteoarthritis, left hip: Secondary | ICD-10-CM | POA: Diagnosis not present

## 2022-01-01 DIAGNOSIS — M25552 Pain in left hip: Secondary | ICD-10-CM | POA: Diagnosis not present

## 2022-01-05 DIAGNOSIS — M25552 Pain in left hip: Secondary | ICD-10-CM | POA: Diagnosis not present

## 2022-01-07 DIAGNOSIS — M25552 Pain in left hip: Secondary | ICD-10-CM | POA: Diagnosis not present

## 2022-01-19 DIAGNOSIS — J209 Acute bronchitis, unspecified: Secondary | ICD-10-CM | POA: Diagnosis not present

## 2022-01-19 DIAGNOSIS — R051 Acute cough: Secondary | ICD-10-CM | POA: Diagnosis not present

## 2022-01-19 DIAGNOSIS — Z03818 Encounter for observation for suspected exposure to other biological agents ruled out: Secondary | ICD-10-CM | POA: Diagnosis not present

## 2022-02-01 DIAGNOSIS — J209 Acute bronchitis, unspecified: Secondary | ICD-10-CM | POA: Diagnosis not present

## 2022-03-11 DIAGNOSIS — L814 Other melanin hyperpigmentation: Secondary | ICD-10-CM | POA: Diagnosis not present

## 2022-03-11 DIAGNOSIS — L298 Other pruritus: Secondary | ICD-10-CM | POA: Diagnosis not present

## 2022-03-11 DIAGNOSIS — L538 Other specified erythematous conditions: Secondary | ICD-10-CM | POA: Diagnosis not present

## 2022-03-11 DIAGNOSIS — L821 Other seborrheic keratosis: Secondary | ICD-10-CM | POA: Diagnosis not present

## 2022-03-11 DIAGNOSIS — L820 Inflamed seborrheic keratosis: Secondary | ICD-10-CM | POA: Diagnosis not present

## 2022-03-11 DIAGNOSIS — Z789 Other specified health status: Secondary | ICD-10-CM | POA: Diagnosis not present

## 2022-03-11 DIAGNOSIS — D1801 Hemangioma of skin and subcutaneous tissue: Secondary | ICD-10-CM | POA: Diagnosis not present

## 2022-05-12 DIAGNOSIS — Z1231 Encounter for screening mammogram for malignant neoplasm of breast: Secondary | ICD-10-CM | POA: Diagnosis not present

## 2022-05-15 ENCOUNTER — Other Ambulatory Visit: Payer: Self-pay

## 2022-05-15 ENCOUNTER — Emergency Department (HOSPITAL_BASED_OUTPATIENT_CLINIC_OR_DEPARTMENT_OTHER)
Admission: EM | Admit: 2022-05-15 | Discharge: 2022-05-15 | Discharge status: Against Medical Advice | Payer: Medicare Other | Attending: Emergency Medicine | Admitting: Emergency Medicine

## 2022-05-15 DIAGNOSIS — Z5321 Procedure and treatment not carried out due to patient leaving prior to being seen by health care provider: Secondary | ICD-10-CM | POA: Diagnosis not present

## 2022-05-15 DIAGNOSIS — K5900 Constipation, unspecified: Secondary | ICD-10-CM | POA: Insufficient documentation

## 2022-05-15 NOTE — ED Triage Notes (Signed)
Patient arrives with complaints of worsening constipation x2 days. Patient also reports straining when trying to have a bowel movement.  Rates pain 8/10.

## 2022-06-10 DIAGNOSIS — Z0000 Encounter for general adult medical examination without abnormal findings: Secondary | ICD-10-CM | POA: Diagnosis not present

## 2022-06-10 DIAGNOSIS — Z23 Encounter for immunization: Secondary | ICD-10-CM | POA: Diagnosis not present

## 2022-06-10 DIAGNOSIS — I10 Essential (primary) hypertension: Secondary | ICD-10-CM | POA: Diagnosis not present

## 2022-06-10 DIAGNOSIS — E559 Vitamin D deficiency, unspecified: Secondary | ICD-10-CM | POA: Diagnosis not present

## 2022-06-10 DIAGNOSIS — E038 Other specified hypothyroidism: Secondary | ICD-10-CM | POA: Diagnosis not present

## 2022-06-10 DIAGNOSIS — E785 Hyperlipidemia, unspecified: Secondary | ICD-10-CM | POA: Diagnosis not present

## 2022-06-10 DIAGNOSIS — M1612 Unilateral primary osteoarthritis, left hip: Secondary | ICD-10-CM | POA: Diagnosis not present

## 2022-09-10 DIAGNOSIS — M25559 Pain in unspecified hip: Secondary | ICD-10-CM | POA: Diagnosis not present

## 2022-09-10 DIAGNOSIS — M25569 Pain in unspecified knee: Secondary | ICD-10-CM | POA: Diagnosis not present

## 2022-09-14 DIAGNOSIS — E7800 Pure hypercholesterolemia, unspecified: Secondary | ICD-10-CM | POA: Diagnosis not present

## 2022-10-05 DIAGNOSIS — M1612 Unilateral primary osteoarthritis, left hip: Secondary | ICD-10-CM | POA: Diagnosis not present

## 2022-11-22 IMAGING — CR DG CHEST 2V
2 series · 2 of 2 positions shown · non-contrast
Comparison: April 28, 2017.

CLINICAL DATA: Abnormal chest sounds.

EXAM:
CHEST - 2 VIEW

[w chest pa]
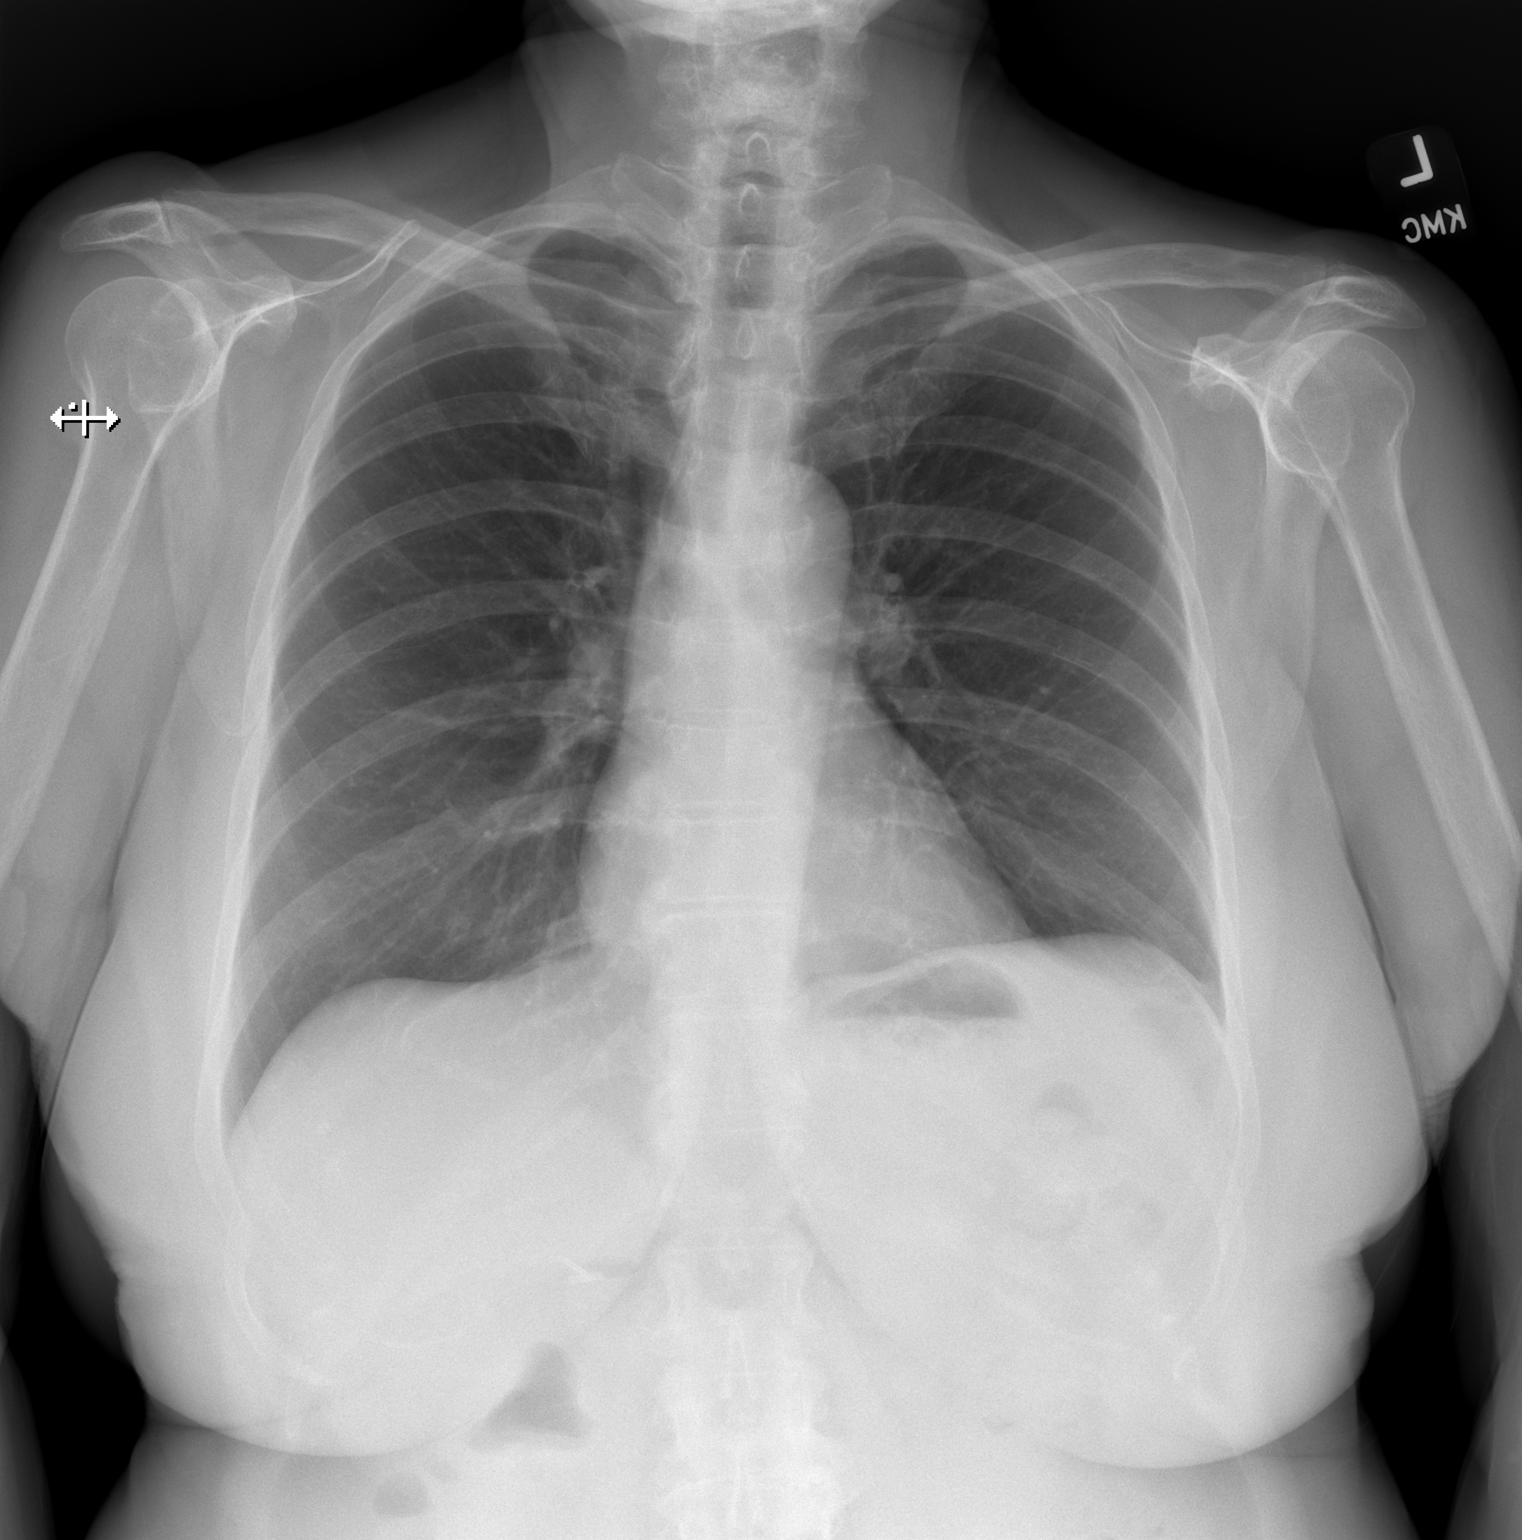

[w chest lat]
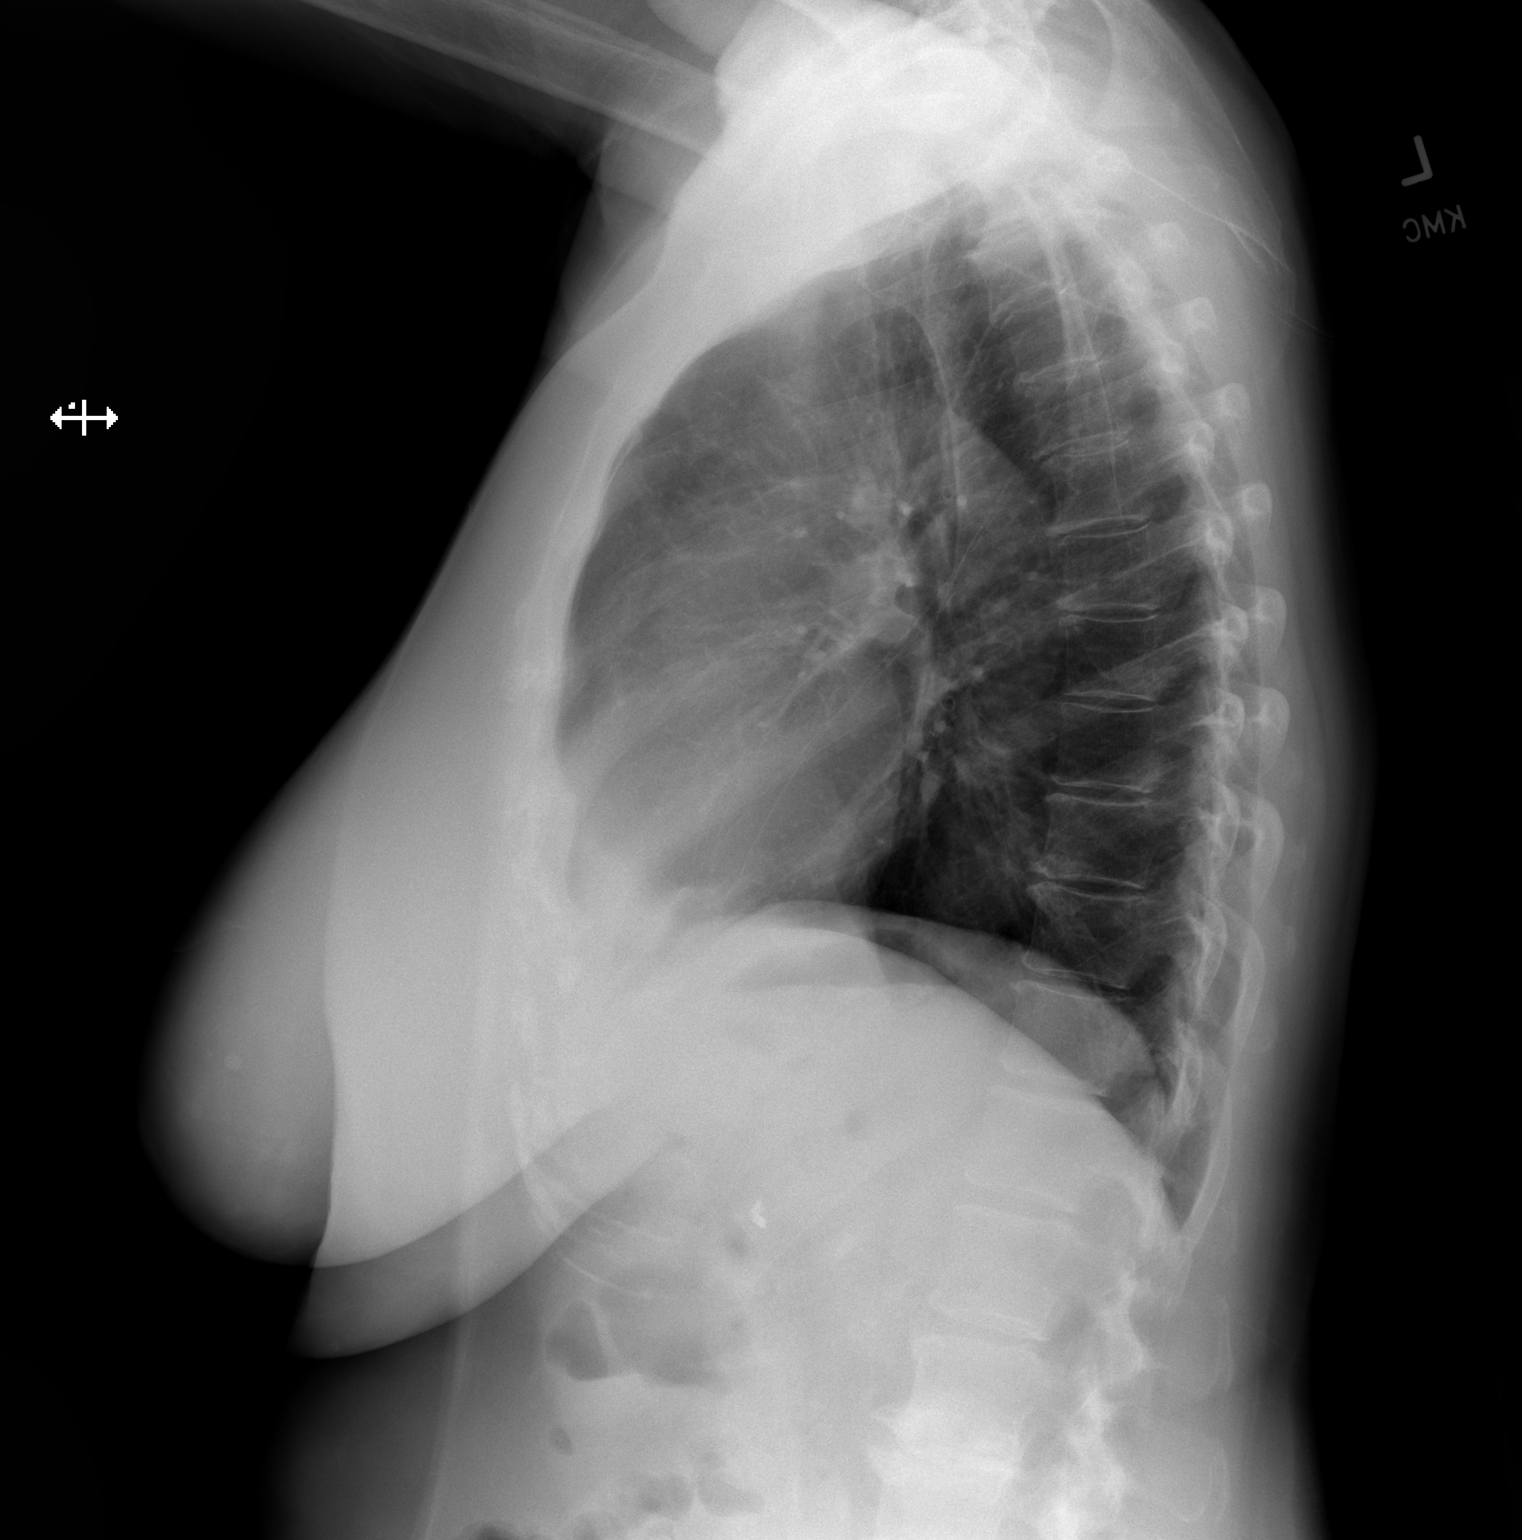

[2 of 2 positions shown; findings below may reference images not displayed]

FINDINGS: The heart size and mediastinal contours are within normal limits.
Both lungs are clear. The visualized skeletal structures are
unremarkable.
IMPRESSION: No active cardiopulmonary disease.

## 2022-11-30 DIAGNOSIS — Z961 Presence of intraocular lens: Secondary | ICD-10-CM | POA: Diagnosis not present

## 2023-03-14 DIAGNOSIS — Z86007 Personal history of in-situ neoplasm of skin: Secondary | ICD-10-CM | POA: Diagnosis not present

## 2023-03-14 DIAGNOSIS — D225 Melanocytic nevi of trunk: Secondary | ICD-10-CM | POA: Diagnosis not present

## 2023-03-14 DIAGNOSIS — R208 Other disturbances of skin sensation: Secondary | ICD-10-CM | POA: Diagnosis not present

## 2023-03-14 DIAGNOSIS — L298 Other pruritus: Secondary | ICD-10-CM | POA: Diagnosis not present

## 2023-03-14 DIAGNOSIS — L821 Other seborrheic keratosis: Secondary | ICD-10-CM | POA: Diagnosis not present

## 2023-03-14 DIAGNOSIS — Z789 Other specified health status: Secondary | ICD-10-CM | POA: Diagnosis not present

## 2023-03-14 DIAGNOSIS — L814 Other melanin hyperpigmentation: Secondary | ICD-10-CM | POA: Diagnosis not present

## 2023-03-14 DIAGNOSIS — Z08 Encounter for follow-up examination after completed treatment for malignant neoplasm: Secondary | ICD-10-CM | POA: Diagnosis not present

## 2023-03-14 DIAGNOSIS — L538 Other specified erythematous conditions: Secondary | ICD-10-CM | POA: Diagnosis not present

## 2023-03-14 DIAGNOSIS — L820 Inflamed seborrheic keratosis: Secondary | ICD-10-CM | POA: Diagnosis not present

## 2023-05-18 DIAGNOSIS — Z1231 Encounter for screening mammogram for malignant neoplasm of breast: Secondary | ICD-10-CM | POA: Diagnosis not present

## 2023-06-17 DIAGNOSIS — Z79899 Other long term (current) drug therapy: Secondary | ICD-10-CM | POA: Diagnosis not present

## 2023-06-17 DIAGNOSIS — E7800 Pure hypercholesterolemia, unspecified: Secondary | ICD-10-CM | POA: Diagnosis not present

## 2023-06-17 DIAGNOSIS — I10 Essential (primary) hypertension: Secondary | ICD-10-CM | POA: Diagnosis not present

## 2023-06-17 DIAGNOSIS — Z23 Encounter for immunization: Secondary | ICD-10-CM | POA: Diagnosis not present

## 2023-06-17 DIAGNOSIS — E038 Other specified hypothyroidism: Secondary | ICD-10-CM | POA: Diagnosis not present

## 2023-06-17 DIAGNOSIS — E785 Hyperlipidemia, unspecified: Secondary | ICD-10-CM | POA: Diagnosis not present

## 2023-06-17 DIAGNOSIS — E559 Vitamin D deficiency, unspecified: Secondary | ICD-10-CM | POA: Diagnosis not present

## 2023-06-17 DIAGNOSIS — Z0000 Encounter for general adult medical examination without abnormal findings: Secondary | ICD-10-CM | POA: Diagnosis not present

## 2023-07-08 DIAGNOSIS — E785 Hyperlipidemia, unspecified: Secondary | ICD-10-CM | POA: Diagnosis not present

## 2023-07-08 DIAGNOSIS — I10 Essential (primary) hypertension: Secondary | ICD-10-CM | POA: Diagnosis not present

## 2023-07-08 DIAGNOSIS — E038 Other specified hypothyroidism: Secondary | ICD-10-CM | POA: Diagnosis not present

## 2023-12-02 DIAGNOSIS — Z961 Presence of intraocular lens: Secondary | ICD-10-CM | POA: Diagnosis not present

## 2023-12-21 DIAGNOSIS — I10 Essential (primary) hypertension: Secondary | ICD-10-CM | POA: Diagnosis not present

## 2023-12-26 DIAGNOSIS — E785 Hyperlipidemia, unspecified: Secondary | ICD-10-CM | POA: Diagnosis not present

## 2023-12-26 DIAGNOSIS — E7800 Pure hypercholesterolemia, unspecified: Secondary | ICD-10-CM | POA: Diagnosis not present

## 2023-12-26 DIAGNOSIS — E038 Other specified hypothyroidism: Secondary | ICD-10-CM | POA: Diagnosis not present

## 2023-12-26 DIAGNOSIS — I10 Essential (primary) hypertension: Secondary | ICD-10-CM | POA: Diagnosis not present

## 2024-01-19 DIAGNOSIS — I10 Essential (primary) hypertension: Secondary | ICD-10-CM | POA: Diagnosis not present

## 2024-01-25 DIAGNOSIS — E7800 Pure hypercholesterolemia, unspecified: Secondary | ICD-10-CM | POA: Diagnosis not present

## 2024-01-25 DIAGNOSIS — E785 Hyperlipidemia, unspecified: Secondary | ICD-10-CM | POA: Diagnosis not present

## 2024-01-25 DIAGNOSIS — E038 Other specified hypothyroidism: Secondary | ICD-10-CM | POA: Diagnosis not present

## 2024-01-25 DIAGNOSIS — I10 Essential (primary) hypertension: Secondary | ICD-10-CM | POA: Diagnosis not present

## 2024-02-03 DIAGNOSIS — R10819 Abdominal tenderness, unspecified site: Secondary | ICD-10-CM | POA: Diagnosis not present

## 2024-02-03 DIAGNOSIS — R634 Abnormal weight loss: Secondary | ICD-10-CM | POA: Diagnosis not present

## 2024-02-08 ENCOUNTER — Other Ambulatory Visit: Payer: Self-pay | Admitting: Family Medicine

## 2024-02-08 DIAGNOSIS — R634 Abnormal weight loss: Secondary | ICD-10-CM

## 2024-02-08 DIAGNOSIS — R10819 Abdominal tenderness, unspecified site: Secondary | ICD-10-CM

## 2024-02-17 ENCOUNTER — Ambulatory Visit
Admission: RE | Admit: 2024-02-17 | Discharge: 2024-02-17 | Disposition: A | Discharge status: Home / Self Care | Source: Ambulatory Visit | Attending: Family Medicine | Admitting: Family Medicine

## 2024-02-17 DIAGNOSIS — R1900 Intra-abdominal and pelvic swelling, mass and lump, unspecified site: Secondary | ICD-10-CM | POA: Diagnosis not present

## 2024-02-17 DIAGNOSIS — R10819 Abdominal tenderness, unspecified site: Secondary | ICD-10-CM

## 2024-02-17 DIAGNOSIS — R634 Abnormal weight loss: Secondary | ICD-10-CM

## 2024-02-17 MED ORDER — IOPAMIDOL (ISOVUE-300) INJECTION 61%
100.0000 mL | Freq: Once | INTRAVENOUS | Status: AC | PRN
  Administered 2024-02-17: 100 mL via INTRAVENOUS

## 2024-02-18 DIAGNOSIS — I10 Essential (primary) hypertension: Secondary | ICD-10-CM | POA: Diagnosis not present

## 2024-02-25 DIAGNOSIS — E7800 Pure hypercholesterolemia, unspecified: Secondary | ICD-10-CM | POA: Diagnosis not present

## 2024-02-25 DIAGNOSIS — E785 Hyperlipidemia, unspecified: Secondary | ICD-10-CM | POA: Diagnosis not present

## 2024-02-25 DIAGNOSIS — I10 Essential (primary) hypertension: Secondary | ICD-10-CM | POA: Diagnosis not present

## 2024-02-25 DIAGNOSIS — E038 Other specified hypothyroidism: Secondary | ICD-10-CM | POA: Diagnosis not present

## 2024-03-13 DIAGNOSIS — Z08 Encounter for follow-up examination after completed treatment for malignant neoplasm: Secondary | ICD-10-CM | POA: Diagnosis not present

## 2024-03-13 DIAGNOSIS — Z86007 Personal history of in-situ neoplasm of skin: Secondary | ICD-10-CM | POA: Diagnosis not present

## 2024-03-13 DIAGNOSIS — L821 Other seborrheic keratosis: Secondary | ICD-10-CM | POA: Diagnosis not present

## 2024-03-13 DIAGNOSIS — D225 Melanocytic nevi of trunk: Secondary | ICD-10-CM | POA: Diagnosis not present

## 2024-03-13 DIAGNOSIS — L814 Other melanin hyperpigmentation: Secondary | ICD-10-CM | POA: Diagnosis not present

## 2024-03-19 DIAGNOSIS — I10 Essential (primary) hypertension: Secondary | ICD-10-CM | POA: Diagnosis not present

## 2024-03-26 DIAGNOSIS — E785 Hyperlipidemia, unspecified: Secondary | ICD-10-CM | POA: Diagnosis not present

## 2024-03-26 DIAGNOSIS — I10 Essential (primary) hypertension: Secondary | ICD-10-CM | POA: Diagnosis not present

## 2024-03-26 DIAGNOSIS — E038 Other specified hypothyroidism: Secondary | ICD-10-CM | POA: Diagnosis not present

## 2024-03-26 DIAGNOSIS — E7800 Pure hypercholesterolemia, unspecified: Secondary | ICD-10-CM | POA: Diagnosis not present

## 2024-04-06 DIAGNOSIS — E46 Unspecified protein-calorie malnutrition: Secondary | ICD-10-CM | POA: Diagnosis not present

## 2024-04-06 DIAGNOSIS — H6123 Impacted cerumen, bilateral: Secondary | ICD-10-CM | POA: Diagnosis not present

## 2024-04-06 DIAGNOSIS — F331 Major depressive disorder, recurrent, moderate: Secondary | ICD-10-CM | POA: Diagnosis not present

## 2024-04-06 DIAGNOSIS — H6091 Unspecified otitis externa, right ear: Secondary | ICD-10-CM | POA: Diagnosis not present

## 2024-04-19 DIAGNOSIS — I10 Essential (primary) hypertension: Secondary | ICD-10-CM | POA: Diagnosis not present

## 2024-04-26 DIAGNOSIS — E038 Other specified hypothyroidism: Secondary | ICD-10-CM | POA: Diagnosis not present

## 2024-04-26 DIAGNOSIS — I10 Essential (primary) hypertension: Secondary | ICD-10-CM | POA: Diagnosis not present

## 2024-04-26 DIAGNOSIS — E785 Hyperlipidemia, unspecified: Secondary | ICD-10-CM | POA: Diagnosis not present

## 2024-04-26 DIAGNOSIS — E7800 Pure hypercholesterolemia, unspecified: Secondary | ICD-10-CM | POA: Diagnosis not present

## 2024-05-19 DIAGNOSIS — I10 Essential (primary) hypertension: Secondary | ICD-10-CM | POA: Diagnosis not present

## 2024-05-21 DIAGNOSIS — H9190 Unspecified hearing loss, unspecified ear: Secondary | ICD-10-CM | POA: Diagnosis not present

## 2024-05-21 DIAGNOSIS — H9312 Tinnitus, left ear: Secondary | ICD-10-CM | POA: Diagnosis not present

## 2024-05-21 DIAGNOSIS — H6992 Unspecified Eustachian tube disorder, left ear: Secondary | ICD-10-CM | POA: Diagnosis not present

## 2024-05-27 DIAGNOSIS — E038 Other specified hypothyroidism: Secondary | ICD-10-CM | POA: Diagnosis not present

## 2024-05-27 DIAGNOSIS — E785 Hyperlipidemia, unspecified: Secondary | ICD-10-CM | POA: Diagnosis not present

## 2024-05-27 DIAGNOSIS — E7800 Pure hypercholesterolemia, unspecified: Secondary | ICD-10-CM | POA: Diagnosis not present

## 2024-05-27 DIAGNOSIS — I10 Essential (primary) hypertension: Secondary | ICD-10-CM | POA: Diagnosis not present

## 2024-06-18 DIAGNOSIS — I10 Essential (primary) hypertension: Secondary | ICD-10-CM | POA: Diagnosis not present

## 2024-06-22 DIAGNOSIS — H903 Sensorineural hearing loss, bilateral: Secondary | ICD-10-CM | POA: Diagnosis not present

## 2024-06-26 DIAGNOSIS — E038 Other specified hypothyroidism: Secondary | ICD-10-CM | POA: Diagnosis not present

## 2024-06-26 DIAGNOSIS — I10 Essential (primary) hypertension: Secondary | ICD-10-CM | POA: Diagnosis not present

## 2024-06-26 DIAGNOSIS — E7800 Pure hypercholesterolemia, unspecified: Secondary | ICD-10-CM | POA: Diagnosis not present

## 2024-06-26 DIAGNOSIS — E785 Hyperlipidemia, unspecified: Secondary | ICD-10-CM | POA: Diagnosis not present

## 2024-07-06 DIAGNOSIS — R634 Abnormal weight loss: Secondary | ICD-10-CM | POA: Diagnosis not present

## 2024-07-06 DIAGNOSIS — H9190 Unspecified hearing loss, unspecified ear: Secondary | ICD-10-CM | POA: Diagnosis not present

## 2024-07-06 DIAGNOSIS — Z23 Encounter for immunization: Secondary | ICD-10-CM | POA: Diagnosis not present

## 2024-07-06 DIAGNOSIS — M545 Low back pain, unspecified: Secondary | ICD-10-CM | POA: Diagnosis not present

## 2024-07-06 DIAGNOSIS — N289 Disorder of kidney and ureter, unspecified: Secondary | ICD-10-CM | POA: Diagnosis not present

## 2024-07-06 DIAGNOSIS — Z0000 Encounter for general adult medical examination without abnormal findings: Secondary | ICD-10-CM | POA: Diagnosis not present

## 2024-07-06 DIAGNOSIS — E7800 Pure hypercholesterolemia, unspecified: Secondary | ICD-10-CM | POA: Diagnosis not present

## 2024-07-06 DIAGNOSIS — I10 Essential (primary) hypertension: Secondary | ICD-10-CM | POA: Diagnosis not present

## 2024-07-18 DIAGNOSIS — M25552 Pain in left hip: Secondary | ICD-10-CM | POA: Diagnosis not present

## 2024-07-18 DIAGNOSIS — M6281 Muscle weakness (generalized): Secondary | ICD-10-CM | POA: Diagnosis not present

## 2024-07-18 DIAGNOSIS — R260 Ataxic gait: Secondary | ICD-10-CM | POA: Diagnosis not present

## 2024-07-18 DIAGNOSIS — I10 Essential (primary) hypertension: Secondary | ICD-10-CM | POA: Diagnosis not present

## 2024-07-23 DIAGNOSIS — H9190 Unspecified hearing loss, unspecified ear: Secondary | ICD-10-CM | POA: Diagnosis not present

## 2024-07-23 DIAGNOSIS — I10 Essential (primary) hypertension: Secondary | ICD-10-CM | POA: Diagnosis not present

## 2024-07-23 DIAGNOSIS — R440 Auditory hallucinations: Secondary | ICD-10-CM | POA: Diagnosis not present

## 2024-07-25 DIAGNOSIS — R260 Ataxic gait: Secondary | ICD-10-CM | POA: Diagnosis not present

## 2024-07-25 DIAGNOSIS — M6281 Muscle weakness (generalized): Secondary | ICD-10-CM | POA: Diagnosis not present

## 2024-07-25 DIAGNOSIS — M25552 Pain in left hip: Secondary | ICD-10-CM | POA: Diagnosis not present

## 2024-07-27 DIAGNOSIS — I10 Essential (primary) hypertension: Secondary | ICD-10-CM | POA: Diagnosis not present

## 2024-07-27 DIAGNOSIS — E038 Other specified hypothyroidism: Secondary | ICD-10-CM | POA: Diagnosis not present

## 2024-07-27 DIAGNOSIS — E7800 Pure hypercholesterolemia, unspecified: Secondary | ICD-10-CM | POA: Diagnosis not present

## 2024-07-27 DIAGNOSIS — E785 Hyperlipidemia, unspecified: Secondary | ICD-10-CM | POA: Diagnosis not present

## 2024-07-31 DIAGNOSIS — M25552 Pain in left hip: Secondary | ICD-10-CM | POA: Diagnosis not present

## 2024-07-31 DIAGNOSIS — M6281 Muscle weakness (generalized): Secondary | ICD-10-CM | POA: Diagnosis not present

## 2024-07-31 DIAGNOSIS — R260 Ataxic gait: Secondary | ICD-10-CM | POA: Diagnosis not present

## 2024-08-01 DIAGNOSIS — R260 Ataxic gait: Secondary | ICD-10-CM | POA: Diagnosis not present

## 2024-08-01 DIAGNOSIS — M25552 Pain in left hip: Secondary | ICD-10-CM | POA: Diagnosis not present

## 2024-08-01 DIAGNOSIS — M6281 Muscle weakness (generalized): Secondary | ICD-10-CM | POA: Diagnosis not present

## 2024-08-06 DIAGNOSIS — L570 Actinic keratosis: Secondary | ICD-10-CM | POA: Diagnosis not present

## 2024-08-06 DIAGNOSIS — L819 Disorder of pigmentation, unspecified: Secondary | ICD-10-CM | POA: Diagnosis not present

## 2024-08-08 DIAGNOSIS — M6281 Muscle weakness (generalized): Secondary | ICD-10-CM | POA: Diagnosis not present

## 2024-08-08 DIAGNOSIS — M25552 Pain in left hip: Secondary | ICD-10-CM | POA: Diagnosis not present

## 2024-08-08 DIAGNOSIS — R260 Ataxic gait: Secondary | ICD-10-CM | POA: Diagnosis not present

## 2024-08-09 DIAGNOSIS — M6281 Muscle weakness (generalized): Secondary | ICD-10-CM | POA: Diagnosis not present

## 2024-08-09 DIAGNOSIS — M25552 Pain in left hip: Secondary | ICD-10-CM | POA: Diagnosis not present

## 2024-08-09 DIAGNOSIS — R260 Ataxic gait: Secondary | ICD-10-CM | POA: Diagnosis not present

## 2024-08-14 DIAGNOSIS — M6281 Muscle weakness (generalized): Secondary | ICD-10-CM | POA: Diagnosis not present

## 2024-08-14 DIAGNOSIS — M25552 Pain in left hip: Secondary | ICD-10-CM | POA: Diagnosis not present

## 2024-08-14 DIAGNOSIS — R260 Ataxic gait: Secondary | ICD-10-CM | POA: Diagnosis not present

## 2024-08-16 DIAGNOSIS — R260 Ataxic gait: Secondary | ICD-10-CM | POA: Diagnosis not present

## 2024-08-16 DIAGNOSIS — M6281 Muscle weakness (generalized): Secondary | ICD-10-CM | POA: Diagnosis not present

## 2024-08-16 DIAGNOSIS — M25552 Pain in left hip: Secondary | ICD-10-CM | POA: Diagnosis not present

## 2024-08-20 DIAGNOSIS — L820 Inflamed seborrheic keratosis: Secondary | ICD-10-CM | POA: Diagnosis not present

## 2024-08-20 DIAGNOSIS — R208 Other disturbances of skin sensation: Secondary | ICD-10-CM | POA: Diagnosis not present

## 2024-08-20 DIAGNOSIS — L2989 Other pruritus: Secondary | ICD-10-CM | POA: Diagnosis not present

## 2024-08-20 DIAGNOSIS — L538 Other specified erythematous conditions: Secondary | ICD-10-CM | POA: Diagnosis not present

## 2024-08-20 DIAGNOSIS — Z789 Other specified health status: Secondary | ICD-10-CM | POA: Diagnosis not present

## 2024-08-26 DIAGNOSIS — I10 Essential (primary) hypertension: Secondary | ICD-10-CM | POA: Diagnosis not present

## 2024-08-26 DIAGNOSIS — E785 Hyperlipidemia, unspecified: Secondary | ICD-10-CM | POA: Diagnosis not present

## 2024-08-26 DIAGNOSIS — E038 Other specified hypothyroidism: Secondary | ICD-10-CM | POA: Diagnosis not present

## 2024-08-26 DIAGNOSIS — E7800 Pure hypercholesterolemia, unspecified: Secondary | ICD-10-CM | POA: Diagnosis not present

## 2024-08-28 DIAGNOSIS — R260 Ataxic gait: Secondary | ICD-10-CM | POA: Diagnosis not present

## 2024-08-28 DIAGNOSIS — M6281 Muscle weakness (generalized): Secondary | ICD-10-CM | POA: Diagnosis not present

## 2024-08-28 DIAGNOSIS — M25552 Pain in left hip: Secondary | ICD-10-CM | POA: Diagnosis not present
